# Patient Record
Sex: Male | Born: 1980 | Race: White | Hispanic: No | Marital: Single | Smoking: Never smoker
Health system: Southern US, Community
[De-identification: ages and names within clinical notes are randomized; demographics above are authoritative.]

## PROBLEM LIST (undated history)

## (undated) ENCOUNTER — Ambulatory Visit: Admission: EM | Payer: Medicaid Other

## (undated) DIAGNOSIS — I10 Essential (primary) hypertension: Secondary | ICD-10-CM

## (undated) DIAGNOSIS — I82409 Acute embolism and thrombosis of unspecified deep veins of unspecified lower extremity: Secondary | ICD-10-CM

## (undated) DIAGNOSIS — T8859XA Other complications of anesthesia, initial encounter: Secondary | ICD-10-CM

## (undated) DIAGNOSIS — R569 Unspecified convulsions: Secondary | ICD-10-CM

## (undated) DIAGNOSIS — T4145XA Adverse effect of unspecified anesthetic, initial encounter: Secondary | ICD-10-CM

## (undated) DIAGNOSIS — M259 Joint disorder, unspecified: Secondary | ICD-10-CM

## (undated) DIAGNOSIS — M1990 Unspecified osteoarthritis, unspecified site: Secondary | ICD-10-CM

## (undated) HISTORY — DX: Essential (primary) hypertension: I10

## (undated) HISTORY — PX: HIP SURGERY: SHX245

---
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
## 2002-09-24 ENCOUNTER — Encounter: Payer: Self-pay | Admitting: *Deleted

## 2002-09-24 ENCOUNTER — Emergency Department (HOSPITAL_COMMUNITY): Admission: EM | Admit: 2002-09-24 | Discharge: 2002-09-24 | Payer: Self-pay | Admitting: *Deleted

## 2004-08-18 ENCOUNTER — Inpatient Hospital Stay (HOSPITAL_COMMUNITY): Admission: EM | Admit: 2004-08-18 | Discharge: 2004-08-20 | Payer: Self-pay | Admitting: Emergency Medicine

## 2006-10-21 IMAGING — NM NM MYOCAR MULTI W/ SPECT
2 series · 12 of 12 positions shown · non-contrast
Comparison: none

HISTORY: Chest pain

[Series 1: cr cardiac tc low dose · 6.52mm/px · 6 of 64 frames shown]
[frame 6/64]
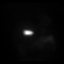
[frame 16/64]
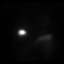
[frame 27/64]
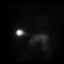
[frame 38/64]
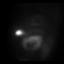
[frame 48/64]
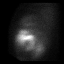
[frame 59/64]
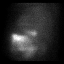

[Series 1: cs cardiac tc hi dose · 6.52mm/px · 6 of 512 frames shown]
[frame 43/512]
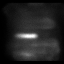
[frame 128/512]
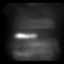
[frame 214/512]
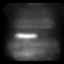
[frame 299/512]
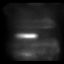
[frame 384/512]
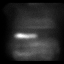
[frame 470/512]
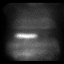

[12 of 12 positions shown; findings below may reference images not displayed]

NUCLEAR MEDICINE MYOCARDIAL PERFUSION SPECT MULTIPLE:
NUCLEAR MEDICINE MYOCARDIAL PERFUSION WALL MOTION:
NUCLEAR MEDICINE MYOCARDIAL PERFUSION EJECTION FRACTION:

At stress, 30 mCi of Ic-EEm Myoview was injected for myocardial perfusion
imaging.
Resting exam performed prior to stress using 10 mCi Ic-EEm Myoview.

Myocardial perfusion SPECT images obtained after stress are normal.
No pulmonary uptake of tracer.
Resting exam unchanged.

Normal left ventricular ejection fraction of 54% calculated on gated SPECT
images after stress.
Calculated EDV 135 ml and ESV 62 ml.
Normal wall motion.
IMPRESSION: Normal exam.

## 2016-01-21 ENCOUNTER — Encounter (HOSPITAL_COMMUNITY): Payer: Self-pay | Admitting: Emergency Medicine

## 2016-01-21 ENCOUNTER — Emergency Department (HOSPITAL_COMMUNITY)
Admission: EM | Admit: 2016-01-21 | Discharge: 2016-01-21 | Disposition: A | Discharge status: Home / Self Care | Payer: Self-pay | Attending: Emergency Medicine | Admitting: Emergency Medicine

## 2016-01-21 DIAGNOSIS — Z79899 Other long term (current) drug therapy: Secondary | ICD-10-CM | POA: Insufficient documentation

## 2016-01-21 DIAGNOSIS — R339 Retention of urine, unspecified: Secondary | ICD-10-CM | POA: Insufficient documentation

## 2016-01-21 HISTORY — DX: Joint disorder, unspecified: M25.9

## 2016-01-21 HISTORY — DX: Unspecified convulsions: R56.9

## 2016-01-21 LAB — CBC WITH DIFFERENTIAL/PLATELET
BASOS ABS: 0 10*3/uL (ref 0.0–0.1)
BASOS PCT: 0 %
Eosinophils Absolute: 0.2 10*3/uL (ref 0.0–0.7)
Eosinophils Relative: 2 %
HCT: 47.9 % (ref 39.0–52.0)
Hemoglobin: 16.2 g/dL (ref 13.0–17.0)
LYMPHS PCT: 31 %
Lymphs Abs: 3 10*3/uL (ref 0.7–4.0)
MCH: 30.2 pg (ref 26.0–34.0)
MCHC: 33.8 g/dL (ref 30.0–36.0)
MCV: 89.4 fL (ref 78.0–100.0)
Monocytes Absolute: 0.8 10*3/uL (ref 0.1–1.0)
Monocytes Relative: 9 %
NEUTROS ABS: 5.5 10*3/uL (ref 1.7–7.7)
NEUTROS PCT: 58 %
PLATELETS: 213 10*3/uL (ref 150–400)
RBC: 5.36 MIL/uL (ref 4.22–5.81)
RDW: 13 % (ref 11.5–15.5)
WBC: 9.5 10*3/uL (ref 4.0–10.5)

## 2016-01-21 LAB — BASIC METABOLIC PANEL
Anion gap: 5 (ref 5–15)
BUN: 13 mg/dL (ref 6–20)
CALCIUM: 8.8 mg/dL — AB (ref 8.9–10.3)
CO2: 30 mmol/L (ref 22–32)
Chloride: 104 mmol/L (ref 101–111)
Creatinine, Ser: 0.68 mg/dL (ref 0.61–1.24)
GFR calc Af Amer: >60 mL/min (ref >60)
GLUCOSE: 79 mg/dL (ref 65–99)
POTASSIUM: 3.7 mmol/L (ref 3.5–5.1)
SODIUM: 139 mmol/L (ref 135–145)

## 2016-01-21 LAB — URINALYSIS, ROUTINE W REFLEX MICROSCOPIC
Bilirubin Urine: NEGATIVE
Glucose, UA: NEGATIVE mg/dL
Hgb urine dipstick: NEGATIVE
KETONES UR: NEGATIVE mg/dL
LEUKOCYTES UA: NEGATIVE
NITRITE: NEGATIVE
PROTEIN: NEGATIVE mg/dL
Specific Gravity, Urine: 1.02 (ref 1.005–1.030)
pH: 5.5 (ref 5.0–8.0)

## 2016-01-21 MED ORDER — PHENAZOPYRIDINE HCL 200 MG PO TABS: 200.0000 mg | ORAL_TABLET | Freq: Three times a day (TID) | ORAL | 0 refills | Status: DC

## 2016-01-21 NOTE — ED Triage Notes (Signed)
Pt c/o frequent and painful urination, but feels that he is unable to completely empty his bladder x 1.5 weeks. Pt states he was treated with 2 rounds of antibiotics x 1 month ago for UTI. Pt has not been seen by urology.

## 2016-01-21 NOTE — ED Notes (Signed)
287 mL noted in bladder.

## 2016-01-21 NOTE — ED Provider Notes (Signed)
[facility] Provider Note   CSN: [id number] Arrival date & time: 01/21/16  1532     History   Chief Complaint Chief Complaint  Patient presents with  . Dysuria    HPI Eddie Marshall is a 35 y.o. male.  HPI   Eddie Marshall is a 35 y.o. male who presents to the Emergency Department complaining of urinary hesitancy for one month.  He describes feeling weak and having to strain to start his urine flow.  He was seen by his PMD at onset of his symptoms and treated for a UTI.  Took a course of an unknown antibiotic without relief and called in a second antibiotic which was Cipro and had some intermittent relief, but now symptoms returned.  He denies hematuria, abdominal or back pain, fever, or vomiting.  Pt had an appt with urology, but cancelled.     Past Medical History:  Diagnosis Date  . Hip disease   . Seizures (HCC)     There are no active problems to display for this patient.   Past Surgical History:  Procedure Laterality Date  . HIP SURGERY    . KNEE SURGERY         Home Medications    Prior to Admission medications   Not on File    Family History No family history on file.  Social History Social History  Substance Use Topics  . Smoking status: Never Smoker  . Smokeless tobacco: Never Used  . Alcohol use No     Allergies   Review of patient's allergies indicates no known allergies.   Review of Systems Review of Systems  Constitutional: Negative for activity change, appetite change, chills and fever.  Respiratory: Negative for chest tightness and shortness of breath.   Cardiovascular: Negative for chest pain.  Gastrointestinal: Negative for abdominal pain, nausea and vomiting.  Genitourinary: Positive for decreased urine volume, difficulty urinating, dysuria and frequency. Negative for discharge, flank pain, hematuria, penile swelling, scrotal swelling, testicular pain and urgency.  Musculoskeletal: Negative for back pain.  Skin: Negative for  rash.  Neurological: Negative for dizziness, weakness and numbness.  Hematological: Negative for adenopathy.  Psychiatric/Behavioral: Negative for confusion.  All other systems reviewed and are negative.    Physical Exam Updated Vital Signs BP 121/74   Pulse 80   Temp 98 F (36.7 C)   Resp 18   Ht 5\' 7"  (1.702 m)   Wt 129.3 kg   SpO2 97%   BMI 44.64 kg/m   Physical Exam  Constitutional: He is oriented to person, place, and time. He appears well-developed and well-nourished. No distress.  HENT:  Head: Normocephalic and atraumatic.  Cardiovascular: Normal rate, regular rhythm and intact distal pulses.   No murmur heard. Pulmonary/Chest: Effort normal and breath sounds normal. No respiratory distress. He has no wheezes. He has no rales.  Abdominal: Soft. Normal appearance. He exhibits no distension and no mass. There is no hepatosplenomegaly. There is no tenderness. There is no rigidity, no rebound, no guarding, no CVA tenderness and no tenderness at McBurney's point.  Genitourinary:  Genitourinary Comments: Digital rectal exam performed.  Limited due to body habitus,  Prostate is NT, does not appear to be enlarged.  Not boggy  Musculoskeletal: Normal range of motion. He exhibits no edema.  Neurological: He is alert and oriented to person, place, and time. Coordination normal.  Skin: Skin is warm and dry. No rash noted.  Nursing note and vitals reviewed.    ED Treatments / Results  Labs (all labs ordered are listed, but only abnormal results are displayed) Labs Reviewed  URINE CULTURE  URINALYSIS, ROUTINE W REFLEX MICROSCOPIC (NOT AT [facility])  CBC WITH DIFFERENTIAL/PLATELET  BASIC METABOLIC PANEL    EKG  EKG Interpretation None       Radiology No results found.  Procedures Procedures (including critical care time)  Medications Ordered in ED Medications - No data to display   Initial Impression / Assessment and Plan / ED Course  I have reviewed the triage  vital signs and the nursing notes.  Pertinent labs & imaging results that were available during my care of the patient were reviewed by me and considered in my medical decision making (see chart for details).  Clinical Course   Pt is well appearing.  Non-toxic.  abd remains soft, NT.    Bladder scan showed 287 cc urine, but scan was performed approx one hr post void. Digital rectal exam was limited due to body habitus, but Prostate was NT not boggy.    Pt agrees to arrange urology f/u.  Return precautions given.  Final Clinical Impressions(s) / ED Diagnoses   Final diagnoses:  Urinary retention    New Prescriptions New Prescriptions   No medications on file     Pauline Ausammy Haden Cavenaugh, PA-C 01/24/16 0848    Bethann BerkshireJoseph Zammit, MD 01/26/16 1432

## 2016-01-21 NOTE — Discharge Instructions (Signed)
Call Dr. Dimas MillinMcKenzie's office to arrange a follow-up appt.

## 2016-01-23 LAB — URINE CULTURE

## 2016-01-30 ENCOUNTER — Ambulatory Visit (INDEPENDENT_AMBULATORY_CARE_PROVIDER_SITE_OTHER): Payer: Self-pay | Admitting: Urology

## 2016-01-30 DIAGNOSIS — R338 Other retention of urine: Secondary | ICD-10-CM

## 2016-01-30 DIAGNOSIS — N35011 Post-traumatic bulbous urethral stricture: Secondary | ICD-10-CM

## 2016-01-30 DIAGNOSIS — R350 Frequency of micturition: Secondary | ICD-10-CM

## 2016-02-02 ENCOUNTER — Other Ambulatory Visit: Payer: Self-pay | Admitting: Radiology

## 2016-02-02 ENCOUNTER — Telehealth: Payer: Self-pay | Admitting: Radiology

## 2016-02-02 DIAGNOSIS — N35011 Post-traumatic bulbous urethral stricture: Secondary | ICD-10-CM

## 2016-02-02 NOTE — Telephone Encounter (Signed)
Notified pt of surgery scheduled with Dr Annabell HowellsWrenn on 02/06/16 & pre-admit testing appt on 02/04/16 @2 :15 at [facility] Short Stay Dept. Pt voices understanding.

## 2016-02-03 NOTE — Patient Instructions (Signed)
Huber Mathers  02/03/2016     @PREFPERIOPPHARMACY @   Your procedure is scheduled on  02/06/2016   Report to [facility] at  1115  A.M.  Call this number if you have problems the morning of surgery:  [phone]   Remember:  Do not eat food or drink liquids after midnight.  Take these medicines the morning of surgery with A SIP OF WATER  Pyridium.   Do not wear jewelry, make-up or nail polish.  Do not wear lotions, powders, or perfumes, or deoderant.  Do not shave 48 hours prior to surgery.  Men may shave face and neck.  Do not bring valuables to the hospital.  [facility] is not responsible for any belongings or valuables.  Contacts, dentures or bridgework may not be worn into surgery.  Leave your suitcase in the car.  After surgery it may be brought to your room.  For patients admitted to the hospital, discharge time will be determined by your treatment team.  Patients discharged the day of surgery will not be allowed to drive home.   Name and phone number of your driver:   family Special instructions:  none Please read over the following fact sheets that you were given. Anesthesia Post-op Instructions and Care and Recovery After Surgery       Cystoscopy Cystoscopy is a procedure that is used to help your caregiver diagnose and sometimes treat conditions that affect your lower urinary tract. Your lower urinary tract includes your bladder and the tube through which urine passes from your bladder out of your body (urethra). Cystoscopy is performed with a thin, tube-shaped instrument (cystoscope). The cystoscope has lenses and a light at the end so that your caregiver can see inside your bladder. The cystoscope is inserted at the entrance of your urethra. Your caregiver guides it through your urethra and into your bladder. There are two main types of cystoscopy:  Flexible cystoscopy (with a flexible cystoscope).  Rigid cystoscopy (with a rigid  cystoscope). Cystoscopy may be recommended for many conditions, including:  Urinary tract infections.  Blood in your urine (hematuria).  Loss of bladder control (urinary incontinence) or overactive bladder.  Unusual cells found in a urine sample.  Urinary blockage.  Painful urination. Cystoscopy may also be done to remove a sample of your tissue to be checked under a microscope (biopsy). It may also be done to remove or destroy bladder stones. LET YOUR CAREGIVER KNOW ABOUT:  Allergies to food or medicine.  Medicines taken, including vitamins, herbs, eyedrops, over-the-counter medicines, and creams.  Use of steroids (by mouth or creams).  Previous problems with anesthetics or numbing medicines.  History of bleeding problems or blood clots.  Previous surgery.  Other health problems, including diabetes and kidney problems.  Possibility of pregnancy, if this applies. PROCEDURE The area around the opening to your urethra will be cleaned. A medicine to numb your urethra (local anesthetic) is used. If a tissue sample or stone is removed during the procedure, you may be given a medicine to make you sleep (general anesthetic). Your caregiver will gently insert the tip of the cystoscope into your urethra. The cystoscope will be slowly glided through your urethra and into your bladder. Sterile fluid will flow through the cystoscope and into your bladder. The fluid will expand and stretch your bladder. This gives your caregiver a better view of your bladder walls. The procedure lasts about 15-20 minutes. AFTER THE PROCEDURE If a local  anesthetic is used, you will be allowed to go home as soon as you are ready. If a general anesthetic is used, you will be taken to a recovery area until you are stable. You may have temporary bleeding and burning on urination.   This information is not intended to replace advice given to you by your health care provider. Make sure you discuss any questions you  have with your health care provider.   Document Released: 04/02/2000 Document Revised: 04/26/2014 Document Reviewed: 09/27/2011 Elsevier Interactive Patient Education 2016 [employer] Cystoscopy, Care After Refer to this sheet in the next few weeks. These instructions provide you with information on caring for yourself after your procedure. Your caregiver may also give you more specific instructions. Your treatment has been planned according to current medical practices, but problems sometimes occur. Call your caregiver if you have any problems or questions after your procedure. HOME CARE INSTRUCTIONS  Things you can do to ease any discomfort after your procedure include:  Drinking enough water and fluids to keep your urine clear or pale yellow.  Taking a warm bath to relieve any burning feelings. SEEK IMMEDIATE MEDICAL CARE IF:   You have an increase in blood in your urine.  You notice blood clots in your urine.  You have difficulty passing urine.  You have the chills.  You have abdominal pain.  You have a fever or persistent symptoms for more than 2-3 days.  You have a fever and your symptoms suddenly get worse. MAKE SURE YOU:   Understand these instructions.  Will watch your condition.  Will get help right away if you are not doing well or get worse.   This information is not intended to replace advice given to you by your health care provider. Make sure you discuss any questions you have with your health care provider.   Document Released: 10/23/2004 Document Revised: 04/26/2014 Document Reviewed: 09/27/2011 Elsevier Interactive Patient Education 2016 [employer] PATIENT INSTRUCTIONS POST-ANESTHESIA  IMMEDIATELY FOLLOWING SURGERY:  Do not drive or operate machinery for the first twenty four hours after surgery.  Do not make any important decisions for twenty four hours after surgery or while taking narcotic pain medications or sedatives.  If you develop intractable  nausea and vomiting or a severe headache please notify your doctor immediately.  FOLLOW-UP:  Please make an appointment with your surgeon as instructed. You do not need to follow up with anesthesia unless specifically instructed to do so.  WOUND CARE INSTRUCTIONS (if applicable):  Keep a dry clean dressing on the anesthesia/puncture wound site if there is drainage.  Once the wound has quit draining you may leave it open to air.  Generally you should leave the bandage intact for twenty four hours unless there is drainage.  If the epidural site drains for more than 36-48 hours please call the anesthesia department.  QUESTIONS?:  Please feel free to call your physician or the hospital operator if you have any questions, and they will be happy to assist you.

## 2016-02-04 ENCOUNTER — Encounter (HOSPITAL_COMMUNITY): Payer: Self-pay

## 2016-02-04 ENCOUNTER — Encounter (HOSPITAL_COMMUNITY)
Admission: RE | Admit: 2016-02-04 | Discharge: 2016-02-04 | Disposition: A | Discharge status: Home / Self Care | Payer: Self-pay | Source: Ambulatory Visit | Attending: Urology | Admitting: Urology

## 2016-02-04 DIAGNOSIS — Z01818 Encounter for other preprocedural examination: Secondary | ICD-10-CM | POA: Insufficient documentation

## 2016-02-04 HISTORY — DX: Adverse effect of unspecified anesthetic, initial encounter: T41.45XA

## 2016-02-04 HISTORY — DX: Unspecified osteoarthritis, unspecified site: M19.90

## 2016-02-04 HISTORY — DX: Other complications of anesthesia, initial encounter: T88.59XA

## 2016-02-06 ENCOUNTER — Ambulatory Visit (HOSPITAL_COMMUNITY): Payer: Self-pay | Admitting: Anesthesiology

## 2016-02-06 ENCOUNTER — Ambulatory Visit (HOSPITAL_COMMUNITY)
Admission: RE | Admit: 2016-02-06 | Discharge: 2016-02-06 | Disposition: A | Discharge status: Home / Self Care | Payer: Self-pay | Source: Ambulatory Visit | Attending: Urology | Admitting: Urology

## 2016-02-06 ENCOUNTER — Encounter (HOSPITAL_COMMUNITY)
Admission: RE | Disposition: A | Discharge status: Home / Self Care | Payer: Self-pay | Source: Ambulatory Visit | Attending: Urology

## 2016-02-06 ENCOUNTER — Ambulatory Visit (HOSPITAL_COMMUNITY): Payer: Self-pay

## 2016-02-06 ENCOUNTER — Encounter (HOSPITAL_COMMUNITY): Payer: Self-pay | Admitting: *Deleted

## 2016-02-06 DIAGNOSIS — N359 Urethral stricture, unspecified: Secondary | ICD-10-CM | POA: Insufficient documentation

## 2016-02-06 DIAGNOSIS — N35919 Unspecified urethral stricture, male, unspecified site: Secondary | ICD-10-CM

## 2016-02-06 DIAGNOSIS — N35011 Post-traumatic bulbous urethral stricture: Secondary | ICD-10-CM

## 2016-02-06 DIAGNOSIS — M1990 Unspecified osteoarthritis, unspecified site: Secondary | ICD-10-CM | POA: Insufficient documentation

## 2016-02-06 DIAGNOSIS — Z96649 Presence of unspecified artificial hip joint: Secondary | ICD-10-CM | POA: Insufficient documentation

## 2016-02-06 DIAGNOSIS — N358 Other urethral stricture: Secondary | ICD-10-CM

## 2016-02-06 HISTORY — PX: CYSTOSCOPY WITH URETHRAL DILATATION: SHX5125

## 2016-02-06 HISTORY — PX: CYSTOSCOPY WITH RETROGRADE URETHROGRAM: SHX6309

## 2016-02-06 SURGERY — CYSTOSCOPY, WITH URETHRAL DILATION
Anesthesia: General

## 2016-02-06 MED ORDER — STERILE WATER FOR IRRIGATION IR SOLN
Status: DC | PRN
Start: 2016-02-06 — End: 2016-02-06
  Administered 2016-02-06: 1000 mL

## 2016-02-06 MED ORDER — OXYCODONE HCL 5 MG/5ML PO SOLN: 5.0000 mg | Freq: Once | ORAL | Status: AC | PRN

## 2016-02-06 MED ORDER — FENTANYL CITRATE (PF) 100 MCG/2ML IJ SOLN
INTRAMUSCULAR | Status: DC | PRN
  Administered 2016-02-06: 25 ug via INTRAVENOUS
  Administered 2016-02-06 (×2): 50 ug via INTRAVENOUS

## 2016-02-06 MED ORDER — STERILE WATER FOR IRRIGATION IR SOLN
Status: DC | PRN
  Administered 2016-02-06: 3000 mL

## 2016-02-06 MED ORDER — LIDOCAINE HCL (PF) 1 % IJ SOLN
INTRAMUSCULAR | Status: AC
  Filled 2016-02-06: qty 5

## 2016-02-06 MED ORDER — FENTANYL CITRATE (PF) 100 MCG/2ML IJ SOLN
INTRAMUSCULAR | Status: AC
  Filled 2016-02-06: qty 2

## 2016-02-06 MED ORDER — LACTATED RINGERS IV SOLN
INTRAVENOUS | Status: DC
  Administered 2016-02-06: 12:00:00 via INTRAVENOUS

## 2016-02-06 MED ORDER — HYDROCODONE-ACETAMINOPHEN 5-325 MG PO TABS: 1.0000 | ORAL_TABLET | Freq: Four times a day (QID) | ORAL | 0 refills | Status: DC | PRN

## 2016-02-06 MED ORDER — MIDAZOLAM HCL 2 MG/2ML IJ SOLN
INTRAMUSCULAR | Status: AC
  Filled 2016-02-06: qty 2

## 2016-02-06 MED ORDER — DIATRIZOATE MEGLUMINE 30 % UR SOLN
URETHRAL | Status: DC | PRN
Start: 2016-02-06 — End: 2016-02-06
  Administered 2016-02-06: 100 mL via URETHRAL

## 2016-02-06 MED ORDER — DEXTROSE 5 % IV SOLN: 3.0000 g | INTRAVENOUS | Status: DC

## 2016-02-06 MED ORDER — OXYCODONE HCL 5 MG PO TABS
5.0000 mg | ORAL_TABLET | Freq: Once | ORAL | Status: AC | PRN
  Administered 2016-02-06: 5 mg via ORAL
  Filled 2016-02-06: qty 1

## 2016-02-06 MED ORDER — DIATRIZOATE MEGLUMINE 30 % UR SOLN
URETHRAL | Status: AC
  Filled 2016-02-06: qty 300

## 2016-02-06 MED ORDER — CEFAZOLIN IN D5W 1 GM/50ML IV SOLN
1.0000 g | INTRAVENOUS | Status: AC
  Filled 2016-02-06: qty 50

## 2016-02-06 MED ORDER — GLYCOPYRROLATE 0.2 MG/ML IJ SOLN
INTRAMUSCULAR | Status: AC
  Filled 2016-02-06: qty 1

## 2016-02-06 MED ORDER — PROPOFOL 10 MG/ML IV BOLUS
INTRAVENOUS | Status: AC
  Filled 2016-02-06: qty 40

## 2016-02-06 MED ORDER — LIDOCAINE HCL 1 % IJ SOLN
INTRAMUSCULAR | Status: DC | PRN
  Administered 2016-02-06: 35 mg via INTRADERMAL

## 2016-02-06 MED ORDER — MIDAZOLAM HCL 5 MG/5ML IJ SOLN
INTRAMUSCULAR | Status: DC | PRN
  Administered 2016-02-06: 2 mg via INTRAVENOUS

## 2016-02-06 MED ORDER — ONDANSETRON HCL 4 MG/2ML IJ SOLN
4.0000 mg | Freq: Four times a day (QID) | INTRAMUSCULAR | Status: AC | PRN
  Administered 2016-02-06: 4 mg via INTRAVENOUS
  Filled 2016-02-06: qty 2

## 2016-02-06 MED ORDER — FENTANYL CITRATE (PF) 100 MCG/2ML IJ SOLN: 25.0000 ug | INTRAMUSCULAR | Status: DC | PRN

## 2016-02-06 MED ORDER — PROPOFOL 10 MG/ML IV BOLUS
INTRAVENOUS | Status: DC | PRN
  Administered 2016-02-06: 40 mg via INTRAVENOUS
  Administered 2016-02-06: 180 mg via INTRAVENOUS

## 2016-02-06 MED ORDER — CEFAZOLIN SODIUM-DEXTROSE 2-4 GM/100ML-% IV SOLN
2.0000 g | INTRAVENOUS | Status: AC
  Administered 2016-02-06: 3 g via INTRAVENOUS
  Filled 2016-02-06: qty 100

## 2016-02-06 SURGICAL SUPPLY — 29 items
BAG DRAIN URO TABLE W/ADPT NS (DRAPE) ×3 IMPLANT
BAG URINE DRAINAGE (UROLOGICAL SUPPLIES) ×3 IMPLANT
BALLN NEPHROSTOMY (BALLOONS) ×3
BALLOON NEPHROSTOMY (BALLOONS) ×1 IMPLANT
CATH FOLEY 2W COUNCIL 5CC 18FR (CATHETERS) ×3 IMPLANT
CATH FOLEY 2WAY SLVR  5CC 16FR (CATHETERS)
CATH FOLEY 2WAY SLVR 5CC 16FR (CATHETERS) IMPLANT
CLOTH BEACON ORANGE TIMEOUT ST (SAFETY) ×3 IMPLANT
ELECT REM PT RETURN 9FT ADLT (ELECTROSURGICAL) ×3
ELECTRODE REM PT RTRN 9FT ADLT (ELECTROSURGICAL) ×1 IMPLANT
GLOVE BIOGEL PI IND STRL 6.5 (GLOVE) ×1 IMPLANT
GLOVE BIOGEL PI IND STRL 7.0 (GLOVE) ×1 IMPLANT
GLOVE BIOGEL PI INDICATOR 6.5 (GLOVE) ×2
GLOVE BIOGEL PI INDICATOR 7.0 (GLOVE) ×2
GLOVE ECLIPSE 6.5 STRL STRAW (GLOVE) ×3 IMPLANT
GLOVE EXAM NITRILE PF MED BLUE (GLOVE) ×3 IMPLANT
GLOVE SURG SS PI 8.0 STRL IVOR (GLOVE) ×3 IMPLANT
GOWN STRL REUS W/TWL LRG LVL3 (GOWN DISPOSABLE) ×6 IMPLANT
GUIDEWIRE STR DUAL SENSOR (WIRE) ×3 IMPLANT
NDL SAFETY ECLIPSE 18X1.5 (NEEDLE) IMPLANT
NEEDLE HYPO 18GX1.5 SHARP (NEEDLE)
NEEDLE HYPO 22GX1.5 SAFETY (NEEDLE) IMPLANT
NS IRRIG 500ML POUR BTL (IV SOLUTION) IMPLANT
PACK CYSTO (CUSTOM PROCEDURE TRAY) ×3 IMPLANT
SYR 20CC LL (SYRINGE) IMPLANT
SYR 30ML LL (SYRINGE) IMPLANT
SYR BULB IRRIGATION 50ML (SYRINGE) ×3 IMPLANT
TOWEL OR 17X26 4PK STRL BLUE (TOWEL DISPOSABLE) ×3 IMPLANT
WATER STERILE IRR 3000ML UROMA (IV SOLUTION) ×3 IMPLANT

## 2016-02-06 NOTE — Brief Op Note (Signed)
02/06/2016  1:25 PM  PATIENT:  Eddie Marshall  35 y.o. male  PRE-OPERATIVE DIAGNOSIS:  uretheral stricture  POST-OPERATIVE DIAGNOSIS:  uretheral stricture  PROCEDURE:  Procedure(s): CYSTOSCOPY WITH URETHRAL DILATATION (N/A) CYSTOSCOPY WITH RETROGRADE URETHROGRAM (N/A)  SURGEON:  Surgeon(s) and Role:    * Eddie PippinJohn Alvar Malinoski, MD - Primary  PHYSICIAN ASSISTANT:   ASSISTANTS: none   ANESTHESIA:   general  EBL:  Total I/O In: 400 [I.V.:400] Out: 5 [Blood:5]  BLOOD ADMINISTERED:none  DRAINS: Urinary Catheter (Foley)   LOCAL MEDICATIONS USED:  NONE  SPECIMEN:  No Specimen  DISPOSITION OF SPECIMEN:  N/A  COUNTS:  YES  TOURNIQUET:  * No tourniquets in log *  DICTATION: .Other Dictation: Dictation Number [id number]  PLAN OF CARE: Discharge to home after PACU  PATIENT DISPOSITION:  PACU - hemodynamically stable.   Delay start of Pharmacological VTE agent (>24hrs) due to surgical blood loss or risk of bleeding: not applicable

## 2016-02-06 NOTE — H&P (Signed)
CC/HPI: Frequency, Nocturia and Urgency     Eddie Marshall is a 35 yo WM with a 1.5 month history of frequency with a sensation of a restricted stream. He voids 10-20x daily and nocturia x 3-4x. There is intermittency and straining with a weak stream. He has had no gross hematuria. He has had no dysuria but it is painful to void. He has some dizziness after voiding. He sometimes feels like he empties partially. He has no flank pain. He has been treated with 2 rounds of antibiotics from [facility] and he went to the ER and got pyridium which helped. He has had no GU surgery but did have a catheter with hip surgery about 3-4 years ago. His symptoms had been present a few months ago resolved and the recurred. He had a similar episode last year. He may have had a kidney stone a year or so ago.     ALLERGIES:     MEDICATIONS: None   GU PSH: None   NON-GU PSH: Hip Replacement    GU PMH: None   NON-GU PMH: Seizure disorder    FAMILY HISTORY: heart - Runs in Family Hypertension - Runs in Family   SOCIAL HISTORY: Marital Status: Single Current Smoking Status: Patient has never smoked.  Has never drank.  Does not drink caffeine.    REVIEW OF SYSTEMS:    GU Review Male:   Patient reports frequent urination, burning/ pain with urination, get up at night to urinate, leakage of urine, stream starts and stops, trouble starting your stream, and have to strain to urinate . Patient denies hard to postpone urination, erection problems, and penile pain.  Gastrointestinal (Upper):   Patient denies vomiting, nausea, and indigestion/ heartburn.  Gastrointestinal (Lower):   Patient denies diarrhea and constipation.  Constitutional:   Patient reports night sweats. Patient denies fever, weight loss, and fatigue.  Skin:   Patient denies skin rash/ lesion and itching.  Eyes:   Patient denies blurred vision and double vision.  Ears/ Nose/ Throat:   Patient reports sore throat. Patient denies sinus problems.   Hematologic/Lymphatic:   Patient denies swollen glands and easy bruising.  Cardiovascular:   Patient denies leg swelling and chest pains.  Respiratory:   Patient denies cough and shortness of breath.  Endocrine:   Patient denies excessive thirst.  Musculoskeletal:   Patient reports back pain and joint pain.   Neurological:   Patient denies headaches and dizziness.  Psychologic:   Patient denies depression and anxiety.   VITAL SIGNS:      01/30/2016 02:30 PM  Weight 280 lb / 127.01 kg  Height 65.5 in / 166.37 cm  BP 111/76 mmHg  Pulse 76 /min  Temperature 98.2 F / 37 C  BMI 45.9 kg/m   GU PHYSICAL EXAMINATION:    Anus and Perineum: No hemorrhoids. No anal stenosis. No rectal fissure, no anal fissure. No edema, no dimple, no perineal tenderness, no anal tenderness.  Scrotum: No lesions. No edema. No cysts. No warts.  Epididymides: Right: no spermatocele, no masses, no cysts, no tenderness, no induration, no enlargement. Left: no spermatocele, no masses, no cysts, no tenderness, no induration, no enlargement.  Testes: No tenderness, no swelling, no enlargement left testes. No tenderness, no swelling, no enlargement right testes. Normal location left testes. Normal location right testes. No mass, no cyst, no varicocele, no hydrocele left testes. No mass, no cyst, no varicocele, no hydrocele right testes.  Urethral Meatus: Normal size. No lesion, no wart, no discharge, no  polyp. Normal location.  Penis: Circumcised, no warts, no cracks. No dorsal Peyronie's plaques, no left corporal Peyronie's plaques, no right corporal Peyronie's plaques, no scarring, no warts. No balanitis, no meatal stenosis.  Prostate: 40 gram or 2+ size. Left lobe normal consistency, right lobe normal consistency. Symmetrical lobes. No prostate nodule. Left lobe no tenderness, right lobe no tenderness.  Seminal Vesicles: Nonpalpable.  Sphincter Tone: Normal sphincter. No rectal tenderness. No rectal mass.     MULTI-SYSTEM PHYSICAL EXAMINATION:    Constitutional: Well-nourished. No physical deformities. Normally developed. Good grooming.  Neck: Neck symmetrical, not swollen. Normal tracheal position.  Respiratory: No labored breathing, no use of accessory muscles.   Cardiovascular: Normal temperature, normal extremity pulses, no swelling, no varicosities.  Lymphatic: No enlargement of neck, axillae, groin.  Skin: No paleness, no jaundice, no cyanosis. No lesion, no ulcer, no rash.  Neurologic / Psychiatric: Oriented to time, oriented to place, oriented to person. No depression, no anxiety, no agitation.  Gastrointestinal: No mass, no tenderness, no rigidity, non obese abdomen.  Eyes: Normal conjunctivae. Normal eyelids.  Ears, Nose, Mouth, and Throat: Left ear no scars, no lesions, no masses. Right ear no scars, no lesions, no masses. Nose no scars, no lesions, no masses. Normal hearing. Normal lips.  Musculoskeletal: Normal gait and station of head and neck.     PAST DATA REVIEWED:  Source Of History:  Patient  Lab Test Review:   CBC, CMP  Records Review:   Previous Doctor Records  Urine Test Review:   Urinalysis, Urine Culture and Sensitivity  Notes:                     [facility]. UA and Culture were negative in the ER on 10/4. CMP and CBC were normal as well.    PROCEDURES:         Flexible Cystoscopy - 52000  Risks, benefits, and some of the potential complications of the procedure were discussed. 10ml of 2% lidocaine jelly was instilled intraurethrally.  Cipro 500mg  given for antibiotic prophylaxis.     Meatus:  Normal size. Normal location. Normal condition.  Urethra:  Severe bulbous stricture. with a small false passage adjacent to the distal portion of the stricture. The visible extent is about 2cm. I couldn't pass beyond this point.       The procedure was well tolerated and there were no complications.  He will need cystoscopy and dilation in the  OR.           PVR Ultrasound - [id number]  Scanned Volume: 510 cc         Urinalysis - 81003 Dipstick Dipstick Cont'd  Specimen: Voided Bilirubin: Neg  Color: Yellow Ketones: Neg  Appearance: Clear Blood: 1+  Specific Gravity: 1.010 Protein: Neg  pH: 5.0 Urobilinogen: 0.2  Glucose: Neg Nitrites: Neg    Leukocyte Esterase: Neg         Notes:   An attempt was made to pass a 9716fr coude prior to cystoscopy and the catheter wouldn't pass beyond the prostate. It was felt cystoscopy was indicated.    ASSESSMENT:      ICD-10 Details  1 GU:   Urinary Frequency - R35.0 He has overflow voiding with a 500ml PVR.   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   3   Urinary Retention - R33.8  PLAN:           Orders Labs Urinalysis w/Scope          Schedule Return Visit: ASAP - Schedule Surgery  Procedure: 01/30/2016 at [facility] - [phone] - PVR Ultrasound (Bladder Scan / Residual Urine) - 19147          Document Letter(s):  Created for Patient: Clinical Summary         Notes:   He has near retention with overflow voiding and a severe bulbar stricture.   I am going to get him set up next week for cystoscopy with an attempt at balloon dilation of the stricture. I reviewed the risks of bleeding, infection, penile injury, erectile dysfunction, need for secondary procedures or a suprapubic tube, thrombotic events and anesthetic complications.   CC: Dr. Elfredia Nevins.

## 2016-02-06 NOTE — Discharge Instructions (Signed)
Cystoscopy, Care After Refer to this sheet in the next few weeks. These instructions provide you with information on caring for yourself after your procedure. Your caregiver may also give you more specific instructions. Your treatment has been planned according to current medical practices, but problems sometimes occur. Call your caregiver if you have any problems or questions after your procedure. HOME CARE INSTRUCTIONS  Things you can do to ease any discomfort after your procedure include:  Drinking enough water and fluids to keep your urine clear or pale yellow.  Taking a warm bath to relieve any burning feelings. SEEK IMMEDIATE MEDICAL CARE IF:   You have an increase in blood in your urine.  You notice blood clots in your urine.  You have difficulty passing urine.  You have the chills.  You have abdominal pain.  You have a fever or persistent symptoms for more than 2-3 days.  You have a fever and your symptoms suddenly get worse. MAKE SURE YOU:   Understand these instructions.  Will watch your condition.  Will get help right away if you are not doing well or get worse.   This information is not intended to replace advice given to you by your health care provider. Make sure you discuss any questions you have with your health care provider.   Document Released: 10/23/2004 Document Revised: 04/26/2014 Document Reviewed: 09/27/2011 Elsevier Interactive Patient Education 2016 [employer] Foley Catheter Care, Adult A Foley catheter is a soft, flexible tube that is placed into the bladder to drain urine. A Foley catheter may be inserted if:  You leak urine or are not able to control when you urinate (urinary incontinence).  You are not able to urinate when you need to (urinary retention).  You had prostate surgery or surgery on the genitals.  You have certain medical conditions, such as multiple sclerosis, dementia, or a spinal cord injury. If you are going home with a  Foley catheter in place, follow the instructions below. TAKING CARE OF THE CATHETER 1. Wash your hands with soap and water. 2. Using mild soap and warm water on a clean washcloth:  Clean the area on your body closest to the catheter insertion site using a circular motion, moving away from the catheter. Never wipe toward the catheter because this could sweep bacteria up into the urethra and cause infection.  Remove all traces of soap. Pat the area dry with a clean towel. For males, reposition the foreskin. 3. Attach the catheter to your leg so there is no tension on the catheter. Use adhesive tape or a leg strap. If you are using adhesive tape, remove any sticky residue left behind by the previous tape you used. 4. Keep the drainage bag below the level of the bladder, but keep it off the floor. 5. Check throughout the day to be sure the catheter is working and urine is draining freely. Make sure the tubing does not become kinked. 6. Do not pull on the catheter or try to remove it. Pulling could damage internal tissues. TAKING CARE OF THE DRAINAGE BAGS You will be given two drainage bags to take home. One is a large overnight drainage bag, and the other is a smaller leg bag that fits underneath clothing. You may wear the overnight bag at any time, but you should never wear the smaller leg bag at night. Follow the instructions below for how to empty, change, and clean your drainage bags. Emptying the Drainage Bag You must empty your drainage bag  when it is  - full or at least 2-3 times a day. 1. Wash your hands with soap and water. 2. Keep the drainage bag below your hips, below the level of your bladder. This stops urine from going back into the tubing and into your bladder. 3. Hold the dirty bag over the toilet or a clean container. 4. Open the pour spout at the bottom of the bag and empty the urine into the toilet or container. Do not let the pour spout touch the toilet, container, or any other  surface. Doing so can place bacteria on the bag, which can cause an infection. 5. Clean the pour spout with a gauze pad or cotton ball that has rubbing alcohol on it. 6. Close the pour spout. 7. Attach the bag to your leg with adhesive tape or a leg strap. 8. Wash your hands well. Changing the Drainage Bag Change your drainage bag once a month or sooner if it starts to smell bad or look dirty. Below are steps to follow when changing the drainage bag. 1. Wash your hands with soap and water. 2. Pinch off the rubber catheter so that urine does not spill out. 3. Disconnect the catheter tube from the drainage tube at the connection valve. Do not let the tubes touch any surface. 4. Clean the end of the catheter tube with an alcohol wipe. Use a different alcohol wipe to clean the end of the drainage tube. 5. Connect the catheter tube to the drainage tube of the clean drainage bag. 6. Attach the new bag to the leg with adhesive tape or a leg strap. Avoid attaching the new bag too tightly. 7. Wash your hands well. Cleaning the Drainage Bag 1. Wash your hands with soap and water. 2. Wash the bag in warm, soapy water. 3. Rinse the bag thoroughly with warm water. 4. Fill the bag with a solution of white vinegar and water (1 cup vinegar to 1 qt warm water [.2 L vinegar to 1 L warm water]). Close the bag and soak it for 30 minutes in the solution. 5. Rinse the bag with warm water. 6. Hang the bag to dry with the pour spout open and hanging downward. 7. Store the clean bag (once it is dry) in a clean plastic bag. 8. Wash your hands well. PREVENTING INFECTION  Wash your hands before and after handling your catheter.  Take showers daily and wash the area where the catheter enters your body. Do not take baths. Replace wet leg straps with dry ones, if this applies.  Do not use powders, sprays, or lotions on the genital area. Only use creams, lotions, or ointments as directed by your caregiver.  For  females, wipe from front to back after each bowel movement.  Drink enough fluids to keep your urine clear or pale yellow unless you have a fluid restriction.  Do not let the drainage bag or tubing touch or lie on the floor.  Wear cotton underwear to absorb moisture and to keep your skin drier. SEEK MEDICAL CARE IF:   Your urine is cloudy or smells unusually bad.  Your catheter becomes clogged.  You are not draining urine into the bag or your bladder feels full.  Your catheter starts to leak. SEEK IMMEDIATE MEDICAL CARE IF:   You have pain, swelling, redness, or pus where the catheter enters the body.  You have pain in the abdomen, legs, lower back, or bladder.  You have a fever.  You see blood  fill the catheter, or your urine is pink or red.  You have nausea, vomiting, or chills.  Your catheter gets pulled out. MAKE SURE YOU:   Understand these instructions.  Will watch your condition.  Will get help right away if you are not doing well or get worse.   This information is not intended to replace advice given to you by your health care provider. Make sure you discuss any questions you have with your health care provider.   Document Released: 04/05/2005 Document Revised: 08/20/2013 Document Reviewed: 03/27/2012 Elsevier Interactive Patient Education 2016 [employer].  You may remove the foley in the morning by cutting the side arm off the catheter.   It should slide out easily.    Call if you have a fever >101 or heavy bleeding.

## 2016-02-06 NOTE — Anesthesia Postprocedure Evaluation (Signed)
Anesthesia Post Note  Patient: Eddie Marshall  Procedure(s) Performed: Procedure(s) (LRB): CYSTOSCOPY WITH URETHRAL DILATATION (N/A) CYSTOSCOPY WITH RETROGRADE URETHROGRAM (N/A)  Patient location during evaluation: PACU Anesthesia Type: General Level of consciousness: awake, oriented and patient cooperative Pain management: pain level controlled Vital Signs Assessment: post-procedure vital signs reviewed and stable Respiratory status: spontaneous breathing, nonlabored ventilation and respiratory function stable Cardiovascular status: blood pressure returned to baseline and stable Postop Assessment: no signs of nausea or vomiting Anesthetic complications: no    Last Vitals:  Vitals:   02/06/16 1150 02/06/16 1340  BP: 131/84   Pulse:    Resp: 11 (!) (P) 21  Temp:  (P) 36.6 C    Last Pain:  Vitals:   02/06/16 1142  TempSrc: Oral  PainSc: 2                  Amaura Authier J

## 2016-02-06 NOTE — Anesthesia Procedure Notes (Signed)
Procedure Name: LMA Insertion Date/Time: 02/06/2016 1:02 PM Performed by: Despina HiddenIDACAVAGE, Eletha Culbertson J Pre-anesthesia Checklist: Patient identified, Patient being monitored, Emergency Drugs available, Timeout performed and Suction available Patient Re-evaluated:Patient Re-evaluated prior to inductionOxygen Delivery Method: Circle System Utilized Preoxygenation: Pre-oxygenation with 100% oxygen Intubation Type: IV induction Ventilation: Mask ventilation without difficulty LMA: LMA inserted LMA Size: 4.0 Number of attempts: 1 Placement Confirmation: positive ETCO2 and breath sounds checked- equal and bilateral Tube secured with: Tape Dental Injury: Teeth and Oropharynx as per pre-operative assessment

## 2016-02-06 NOTE — Anesthesia Preprocedure Evaluation (Signed)
Anesthesia Evaluation  Patient identified by MRN, date of birth, ID band Patient awake    Reviewed: Allergy & Precautions, H&P , NPO status , Patient's Chart, lab work & pertinent test results  Airway Mallampati: II   Neck ROM: full    Dental   Pulmonary neg pulmonary ROS,    breath sounds clear to auscultation       Cardiovascular negative cardio ROS   Rhythm:regular Rate:Normal     Neuro/Psych Seizures -,     GI/Hepatic   Endo/Other    Renal/GU      Musculoskeletal  (+) Arthritis ,   Abdominal   Peds  Hematology   Anesthesia Other Findings   Reproductive/Obstetrics                             Anesthesia Physical Anesthesia Plan  ASA: II  Anesthesia Plan: General   Post-op Pain Management:    Induction: Intravenous  Airway Management Planned: LMA  Additional Equipment:   Intra-op Plan:   Post-operative Plan:   Informed Consent: I have reviewed the patients History and Physical, chart, labs and discussed the procedure including the risks, benefits and alternatives for the proposed anesthesia with the patient or authorized representative who has indicated his/her understanding and acceptance.     Plan Discussed with: CRNA, Anesthesiologist and Surgeon  Anesthesia Plan Comments:         Anesthesia Quick Evaluation

## 2016-02-06 NOTE — Transfer of Care (Signed)
Immediate Anesthesia Transfer of Care Note  Patient: Eddie Marshall  Procedure(s) Performed: Procedure(s): CYSTOSCOPY WITH URETHRAL DILATATION (N/A) CYSTOSCOPY WITH RETROGRADE URETHROGRAM (N/A)  Patient Location: PACU  Anesthesia Type:General  Level of Consciousness: awake and patient cooperative  Airway & Oxygen Therapy: Patient Spontanous Breathing and Patient connected to face mask oxygen  Post-op Assessment: Report given to RN, Post -op Vital signs reviewed and stable and Patient moving all extremities  Post vital signs: Reviewed and stable  Last Vitals:  Vitals:   02/06/16 1142 02/06/16 1150  BP: 132/88 131/84  Pulse: 67   Resp: 16 11  Temp: 36.6 C     Last Pain:  Vitals:   02/06/16 1142  TempSrc: Oral  PainSc: 2       Patients Stated Pain Goal: 7 (02/06/16 1142)  Complications: No apparent anesthesia complications

## 2016-02-07 NOTE — Op Note (Signed)
Eddie Marshall, Eddie Marshall                 ACCOUNT NO.:  [id number]  MEDICAL RECORD NO.:  [id number]  LOCATION:  [facility]                          FACILITY:  [facility]  PHYSICIAN:  Eddie Seltzer. Annabell Howells, M.D.    DATE OF BIRTH:  May 12, 1980  DATE OF PROCEDURE:  02/06/2016 DATE OF DISCHARGE:  02/06/2016                              OPERATIVE REPORT   PROCEDURES: 1. Retrograde urethrogram with interpretation. 2. Cystoscopy with balloon dilation of urethral stricture.  PREOPERATIVE DIAGNOSIS:  Proximal urethral stricture.  POSTOPERATIVE DIAGNOSIS:  Proximal urethral stricture.  SURGEON:  Eddie Seltzer. Annabell Howells, M.D.  ANESTHESIA:  General.  SPECIMEN:  None.  DRAINS:  An 18-French Council catheter.  BLOOD LOSS:  Minimal.  COMPLICATIONS:  None.  INDICATIONS:  Eddie Marshall is a 36 year old white male, who presented to the office last week with difficulty voiding.  He was found to have severe proximal stricture on cystoscopy and presents today for attempted dilation.  FINDINGS OF PROCEDURE:  He was given 3 g of Ancef.  He was taken to the operating room where general anesthetic was induced.  He was placed in lithotomy position.  His perineum and genitalia were prepped with Betadine solution and he was draped in usual sterile fashion.  A retrograde urethrogram was performed using an asepto syringe and Cystografin. Examination revealed a normal caliber ureter to the bulb, where contrast would not efflux through the stricture into the prostatic urethra.  At this point, the 6.5-French semi-rigid ureteroscope was passed per urethra to allowed access to the area of the stricture.  There was a small false passage in an area of mild-to-moderate stricture ring in the bulb.  The false passage was from his Foley catheter attempted last week.  Proximal to that, there was a pinpoint stricture in the proximal bulb.  I was able to get a Sensor guidewire through the ostia of the stricture into the bladder.  The ureteroscope was  removed and the 15 cm 24-French balloon dilation catheter was passed into the bladder.  Under fluoroscopic guidance, the balloon was dilated to 20 atmospheres.  This was left for 10 minutes, then it was deflated.  A second dilation was performed.  At this point, inspection was performed with a 23-French cystoscope with a 30-degree lens.  Examination revealed a normal anterior urethra to the level of the stricture.  The disrupted stricture was approximately 2 cm in length.  There was some dystrophic calcification at the level of the membranous urethra.  The external sphincter was intact.  The prostatic urethra was short with some bilobar hyperplasia with no obstruction. Examination of bladder revealed a smooth wall with mild erythema.  No tumors or stones were noted.  Ureteral orifices were unremarkable.  The cystoscope was removed and an 18-French Council catheter was passed over the wire into the bladder.  It was rather tight going through.  The wire was removed.  The balloon was filled with 10 mL of sterile fluid and the catheter was placed to straight drainage.  The patient was taken down from lithotomy position.  His anesthesia was reversed.  He was moved to recovery room in stable condition.  We will keep this catheter overnight and  return to see us in the near future for flow rate.     Eddie Marshall, M.D.     JJW/MEDQ  D:  02/06/2016  T:  02/07/2016  Job:  [id number]

## 2016-02-11 ENCOUNTER — Encounter (HOSPITAL_COMMUNITY): Payer: Self-pay | Admitting: Urology

## 2016-02-20 ENCOUNTER — Ambulatory Visit (INDEPENDENT_AMBULATORY_CARE_PROVIDER_SITE_OTHER): Payer: Self-pay | Admitting: Urology

## 2016-02-20 DIAGNOSIS — N359 Urethral stricture, unspecified: Secondary | ICD-10-CM

## 2016-03-26 ENCOUNTER — Ambulatory Visit (INDEPENDENT_AMBULATORY_CARE_PROVIDER_SITE_OTHER): Payer: Self-pay | Admitting: Urology

## 2016-03-26 DIAGNOSIS — N35011 Post-traumatic bulbous urethral stricture: Secondary | ICD-10-CM

## 2016-07-09 ENCOUNTER — Ambulatory Visit: Payer: Self-pay | Admitting: Urology

## 2019-01-10 ENCOUNTER — Other Ambulatory Visit: Payer: Self-pay

## 2019-01-10 DIAGNOSIS — Z20822 Contact with and (suspected) exposure to covid-19: Secondary | ICD-10-CM

## 2019-01-12 LAB — NOVEL CORONAVIRUS, NAA: SARS-CoV-2, NAA: NOT DETECTED

## 2019-01-17 ENCOUNTER — Other Ambulatory Visit: Payer: Self-pay

## 2019-01-17 DIAGNOSIS — Z20822 Contact with and (suspected) exposure to covid-19: Secondary | ICD-10-CM

## 2019-01-18 LAB — NOVEL CORONAVIRUS, NAA: SARS-CoV-2, NAA: DETECTED — AB

## 2019-02-02 ENCOUNTER — Other Ambulatory Visit: Payer: Self-pay | Admitting: Family Medicine

## 2019-02-02 ENCOUNTER — Other Ambulatory Visit (HOSPITAL_COMMUNITY): Payer: Self-pay | Admitting: Family Medicine

## 2019-02-02 DIAGNOSIS — I8002 Phlebitis and thrombophlebitis of superficial vessels of left lower extremity: Secondary | ICD-10-CM

## 2019-02-08 ENCOUNTER — Other Ambulatory Visit: Payer: Self-pay

## 2019-02-08 ENCOUNTER — Ambulatory Visit (HOSPITAL_COMMUNITY)
Admission: RE | Admit: 2019-02-08 | Discharge: 2019-02-08 | Disposition: A | Discharge status: Home / Self Care | Payer: Self-pay | Source: Ambulatory Visit | Attending: Family Medicine | Admitting: Family Medicine

## 2019-02-08 DIAGNOSIS — I8002 Phlebitis and thrombophlebitis of superficial vessels of left lower extremity: Secondary | ICD-10-CM | POA: Insufficient documentation

## 2021-04-10 IMAGING — US US EXTREM LOW VENOUS*L*
1 series · 13 of 24 positions shown · non-contrast
Comparison: NO PRIOR.

CLINICAL DATA: Thrombophlebitis.



[Series 1: us extrem low venous*left* · 0.09mm/px · 13 of 58 slices shown]
[im 1/58]
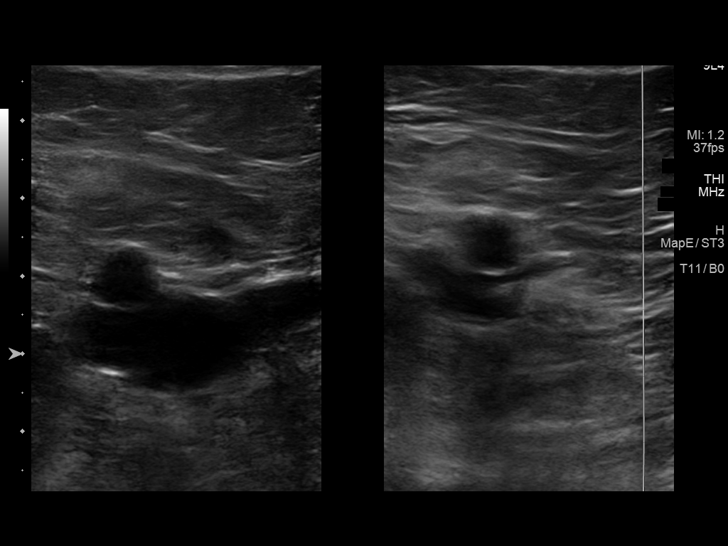
[im 5/58]
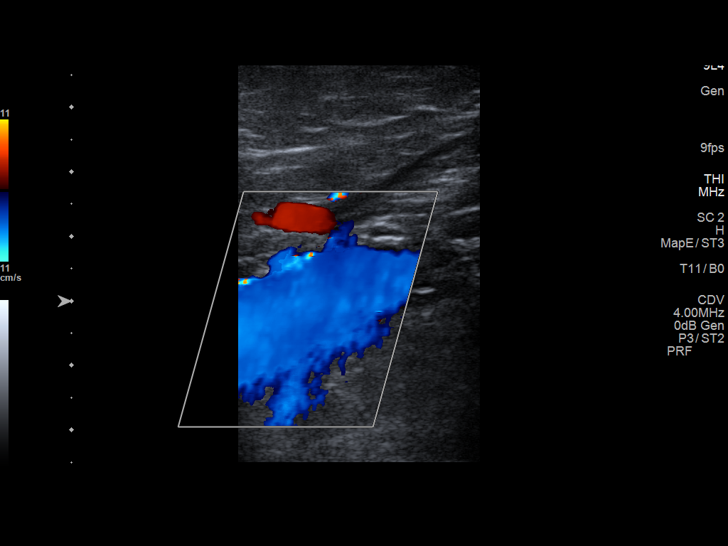
[im 10/58]
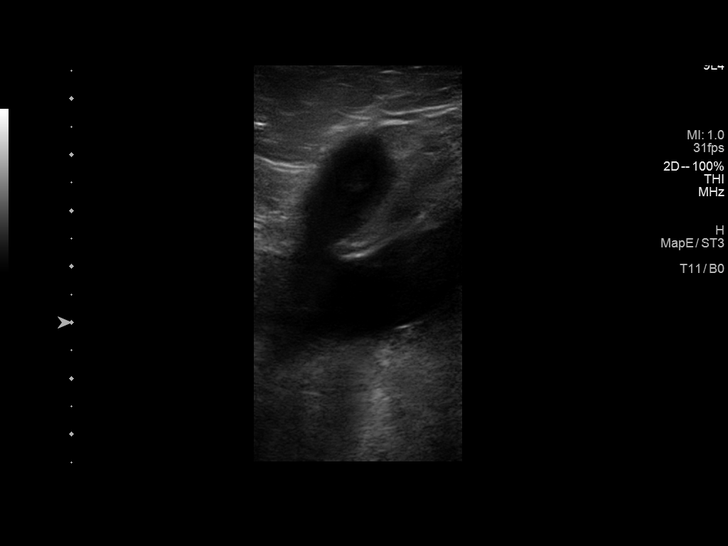
[im 15/58]
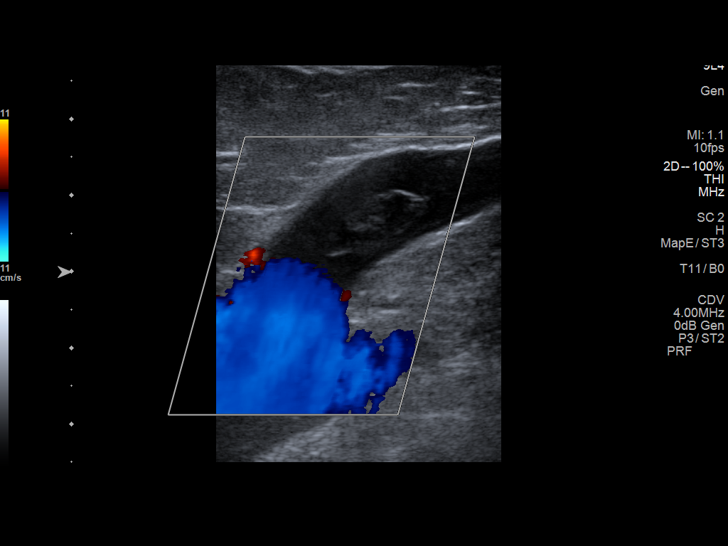
[im 20/58]
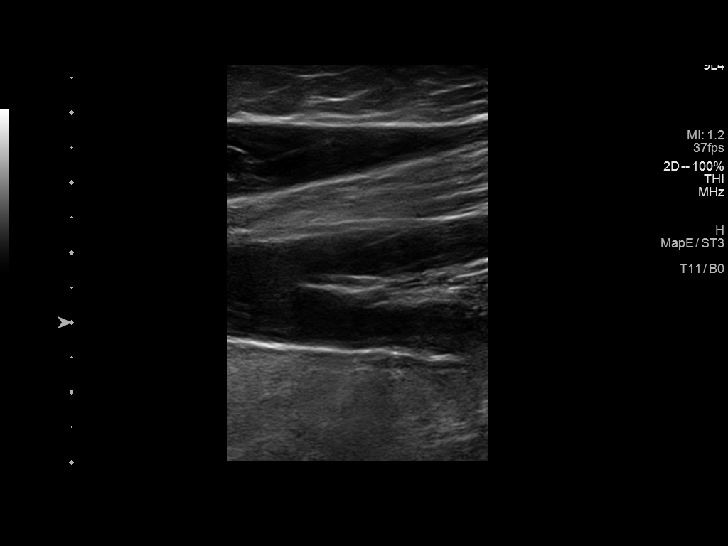
[im 25/58]
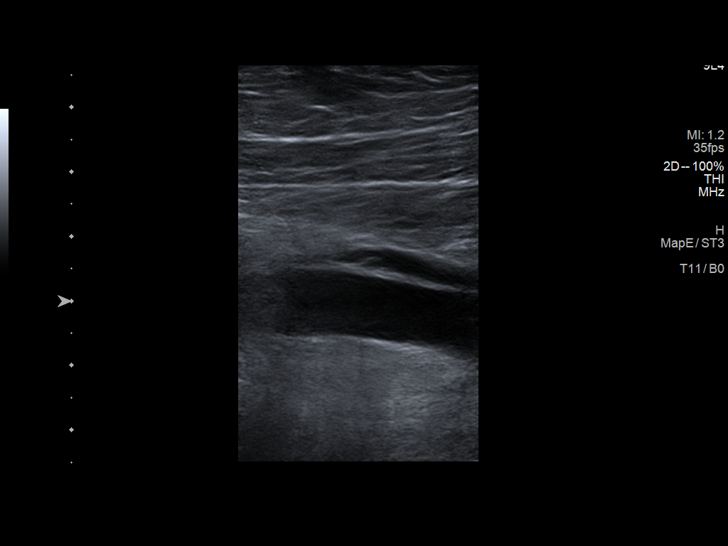
[im 30/58]
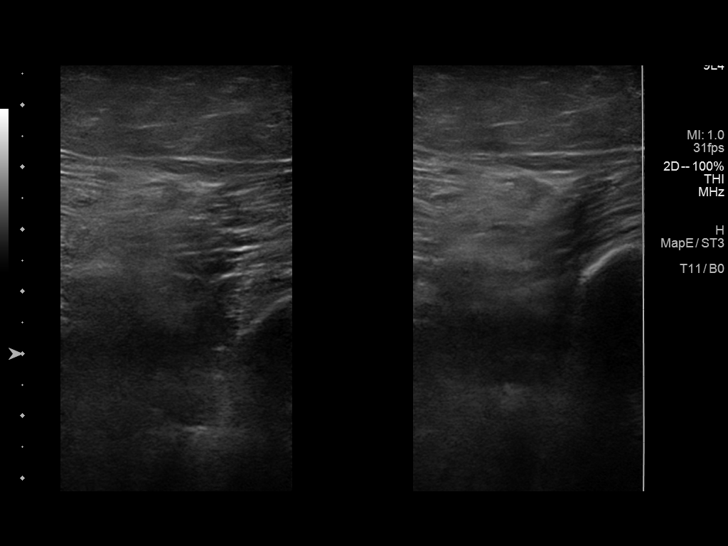
[im 33/58]
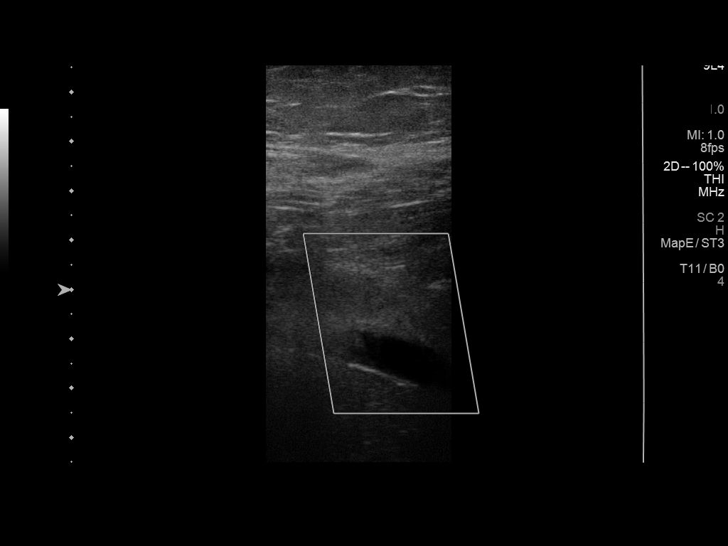
[im 38/58]
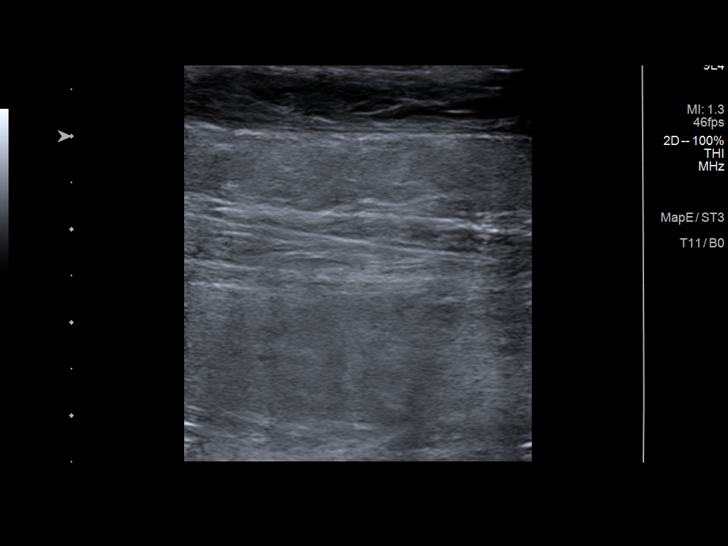
[im 43/58]
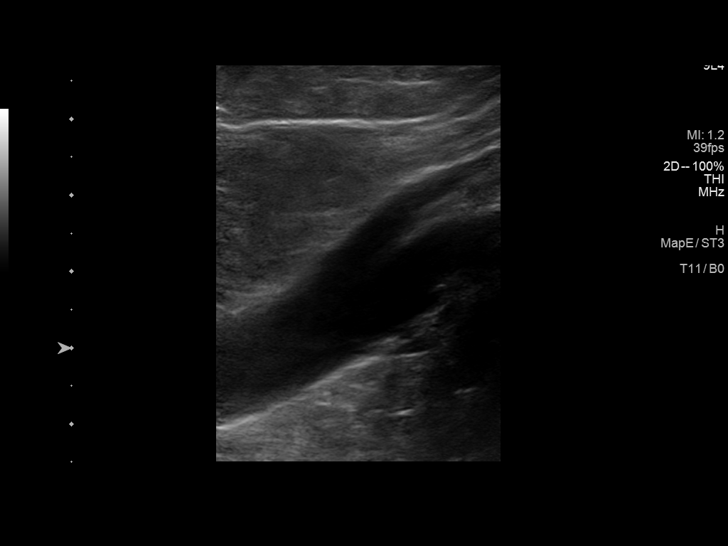
[im 48/58]
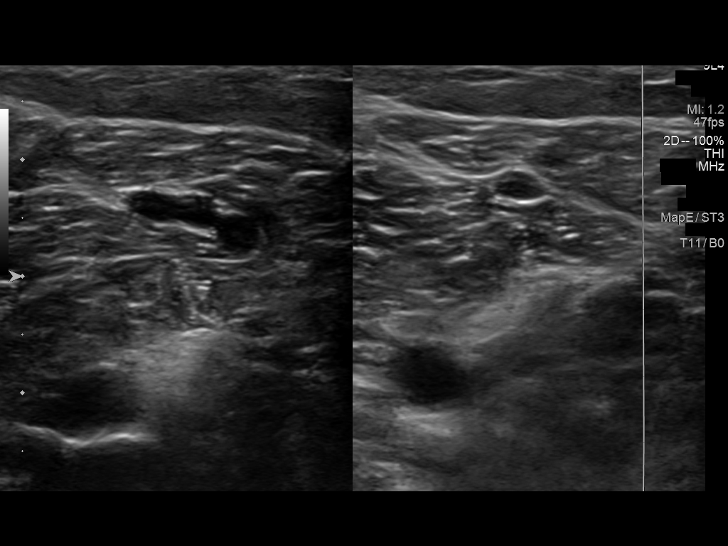
[im 53/58]
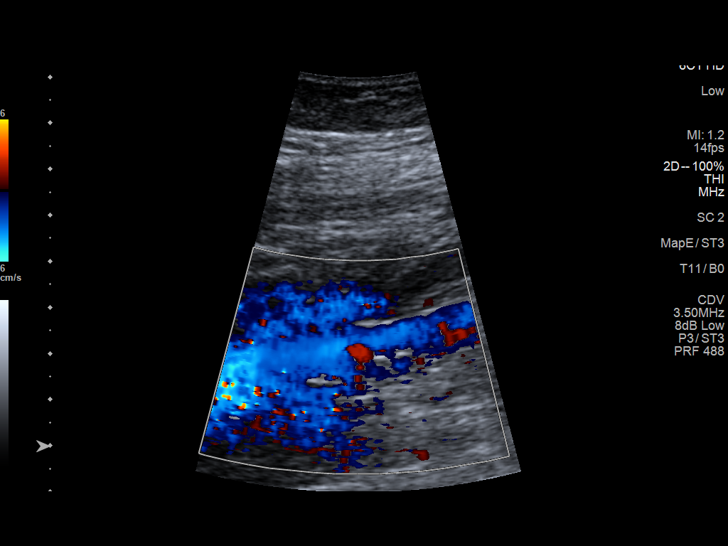
[im 58/58]
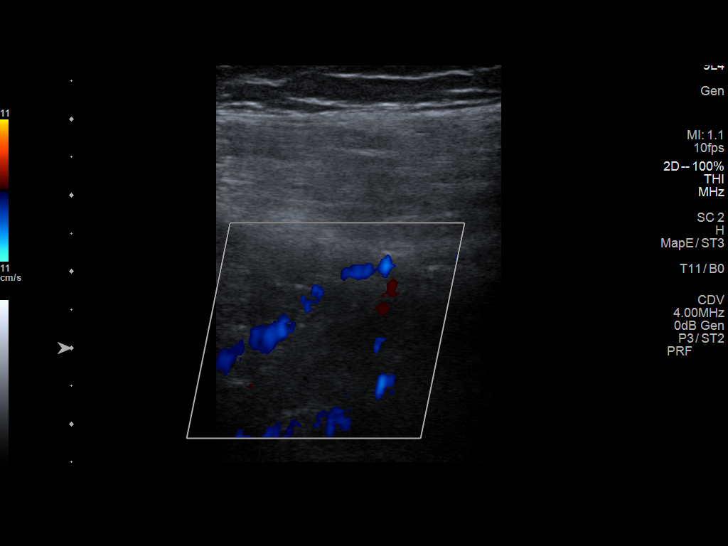

[13 of 24 positions shown; findings below may reference images not displayed]

FINDINGS: Contralateral Common Femoral Vein: Respiratory phasicity is normal
and symmetric with the symptomatic side. No evidence of thrombus.
Normal compressibility.

Common Femoral Vein: No evidence of thrombus. Normal
compressibility, respiratory phasicity and response to augmentation.

Saphenofemoral Junction: No evidence of thrombus. Normal
compressibility and flow on color Doppler imaging.

Profunda Femoral Vein: No evidence of thrombus. Normal
compressibility and flow on color Doppler imaging.

Femoral Vein: No evidence of thrombus. Normal compressibility,
respiratory phasicity and response to augmentation.

Popliteal Vein: No evidence of thrombus. Normal compressibility,
respiratory phasicity and response to augmentation.

Calf Veins: No evidence of thrombus. Normal compressibility and flow
on color Doppler imaging.

Superficial Great Saphenous Vein: Thrombus noted throughout the left
greater saphenous vein in the thigh.

Other Findings:  None.
IMPRESSION: 1. Left greater saphenous vein thrombus consistent with clinical
diagnosis of superficial thrombophlebitis.

2.  No evidence of deep venous thrombosis.

## 2022-07-23 ENCOUNTER — Ambulatory Visit: Payer: Medicaid Other | Admitting: Internal Medicine

## 2022-08-13 ENCOUNTER — Ambulatory Visit: Payer: Medicaid Other | Admitting: Internal Medicine

## 2022-08-13 ENCOUNTER — Encounter: Payer: Self-pay | Admitting: Internal Medicine

## 2022-08-13 VITALS — BP 155/88 | HR 84 | Temp 97.8°F | Ht 67.0 in | Wt 301.0 lb

## 2022-08-13 DIAGNOSIS — Z1159 Encounter for screening for other viral diseases: Secondary | ICD-10-CM

## 2022-08-13 DIAGNOSIS — M160 Bilateral primary osteoarthritis of hip: Secondary | ICD-10-CM

## 2022-08-13 DIAGNOSIS — R42 Dizziness and giddiness: Secondary | ICD-10-CM

## 2022-08-13 DIAGNOSIS — Z131 Encounter for screening for diabetes mellitus: Secondary | ICD-10-CM

## 2022-08-13 DIAGNOSIS — Z114 Encounter for screening for human immunodeficiency virus [HIV]: Secondary | ICD-10-CM

## 2022-08-13 DIAGNOSIS — Z1321 Encounter for screening for nutritional disorder: Secondary | ICD-10-CM

## 2022-08-13 DIAGNOSIS — Z9189 Other specified personal risk factors, not elsewhere classified: Secondary | ICD-10-CM

## 2022-08-13 DIAGNOSIS — E782 Mixed hyperlipidemia: Secondary | ICD-10-CM

## 2022-08-13 DIAGNOSIS — Z0001 Encounter for general adult medical examination with abnormal findings: Secondary | ICD-10-CM | POA: Diagnosis not present

## 2022-08-13 DIAGNOSIS — R053 Chronic cough: Secondary | ICD-10-CM | POA: Insufficient documentation

## 2022-08-13 DIAGNOSIS — R0683 Snoring: Secondary | ICD-10-CM

## 2022-08-13 DIAGNOSIS — Z1329 Encounter for screening for other suspected endocrine disorder: Secondary | ICD-10-CM | POA: Diagnosis not present

## 2022-08-13 DIAGNOSIS — I10 Essential (primary) hypertension: Secondary | ICD-10-CM

## 2022-08-13 MED ORDER — ALBUTEROL SULFATE HFA 108 (90 BASE) MCG/ACT IN AERS: 2.0000 | INHALATION_SPRAY | Freq: Four times a day (QID) | RESPIRATORY_TRACT | 2 refills | Status: DC | PRN

## 2022-08-13 NOTE — Assessment & Plan Note (Signed)
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  He was evaluated by orthopedic surgery (Dr. Everlena Cooper) in January and was told he would need to lose weight.  He does not currently have follow-up scheduled orthopedic surgery and is interested in establishing care with a provider closer to home.  Currently using NSAIDs as needed for pain relief but is interested in an intra-articular corticosteroid injection. -Orthopedic surgery referral placed for [facility]

## 2022-08-13 NOTE — Assessment & Plan Note (Signed)
Previously documented history of hypertension.  He is currently prescribed olmesartan 40 mg daily.  BP was elevated initially, but improved by the end of his appointment.  He endorses episodic dizziness that is triggered by sudden change in position.  Orthostatics were negative today. -No medication changes today.  Follow-up in 4 weeks for reassessment.

## 2022-08-13 NOTE — Assessment & Plan Note (Signed)
Presenting today to establish care.  Available records and labs have been reviewed. -Baseline labs ordered today, including one-time HIV/HCV screenings -Referrals have been placed to pulmonology, orthopedic surgery, and medical nutrition therapy -We will tentatively plan for follow-up in 4 weeks for lab review, symptom reassessment, and to discuss medication options for weight loss

## 2022-08-13 NOTE — Assessment & Plan Note (Signed)
BMI 47.1 today.  He expresses an interest in weight loss in order to improve his surgical candidacy.  His exercise capacity is limited secondary to hip pain.  He is not following any specific diet currently. -Referral to medical nutrition therapy placed today -Baseline labs have been ordered -We will tentatively plan for follow-up in 4 weeks to review labs and discuss appropriate medications for weight loss.  In the interim, I have recommended that he contact his insurance company to see if they will cover Wegovy or Zepbound though I also expressed my concern that neither medication will be covered.

## 2022-08-13 NOTE — Assessment & Plan Note (Signed)
He endorses an episodic persistent, dry cough and requests an albuterol inhaler for as needed symptom relief. -Albuterol prescribed for as needed symptom relief -Referred to pulmonology for OSA evaluation

## 2022-08-13 NOTE — Assessment & Plan Note (Signed)
He endorses recent episodes of dizziness that are associated with sudden changes in position.  Orthostatics were negative today.  No nystagmus appreciated on exam. -Baseline labs ordered today -Follow-up in 4 weeks for reassessment

## 2022-08-13 NOTE — Assessment & Plan Note (Signed)
His sister states that he snores very loudly at night.  He endorses daytime fatigue as well. -Sleep medicine referral placed today for OSA eval

## 2022-08-13 NOTE — Progress Notes (Signed)
New Patient Office Visit  Subjective    Patient ID: Eddie Marshall, male    DOB: 1980/07/22  Age: 42 y.o. MRN: [id number]  CC:  Chief Complaint  Patient presents with   Establish Care    Per pt have dizzy spills off and since age 57 and one recently    HPI Eddie Marshall presents to establish care.  He is a 42 year old male with past medical history significant for HTN, bilateral hip OA, and morbid obesity.  He has most recently received care at [facility] internal medicine.  [name] is accompanied by his sister today and has multiple acute concerns to discuss.  He is interested in losing weight as he is not currently a candidate for hip replacement surgery.  He has recently been evaluated by orthopedic surgery in [facility] and was told that he would need to lose a significant amount of weight to improve his surgical candidacy.  His sister is concerned for an underlying pulmonary disorder as she states that he snores quite loudly at night.  [name] endorses daytime fatigue as well.  His last concern is a persistent cough and he requests an albuterol inhaler for as needed symptom relief.  He currently works at [employer] and denies tobacco, alcohol, and illicit drug use.  His family medical history is significant for dementia, hypertension, and strokes in multiple family members.  Acute concerns, chronic medical conditions, and outstanding preventative care items discussed today are individually addressed A/P below.   Outpatient Encounter Medications as of 08/13/2022  Medication Sig   albuterol (VENTOLIN HFA) 108 (90 Base) MCG/ACT inhaler Inhale 2 puffs into the lungs every 6 (six) hours as needed for wheezing or shortness of breath.   lidocaine (LIDODERM) 5 % Place 1 patch onto the skin daily. Remove & Discard patch within 12 hours or as directed by MD   olmesartan (BENICAR) 40 MG tablet Take 40 mg by mouth daily.   [DISCONTINUED] HYDROcodone-acetaminophen (NORCO) 5-325 MG tablet Take 1 tablet  by mouth every 6 (six) hours as needed for moderate pain. (Patient not taking: Reported on 08/13/2022)   [DISCONTINUED] ibuprofen (ADVIL,MOTRIN) 200 MG tablet Take 400 mg by mouth every 8 (eight) hours as needed (for pain/headache.). (Patient not taking: Reported on 08/13/2022)   [DISCONTINUED] phenazopyridine (PYRIDIUM) 200 MG tablet Take 1 tablet (200 mg total) by mouth 3 (three) times daily. (Patient not taking: Reported on 02/02/2016)   No facility-administered encounter medications on file as of 08/13/2022.    Past Medical History:  Diagnosis Date   Arthritis    Complication of anesthesia    Pt had an epidural that went into his lungs   Hip disease    Hypertension    Seizures (HCC)     Past Surgical History:  Procedure Laterality Date   CYSTOSCOPY WITH RETROGRADE URETHROGRAM N/A 02/06/2016   Procedure: CYSTOSCOPY WITH RETROGRADE URETHROGRAM;  Surgeon: Bjorn Pippin, MD;  Location: [facility];  Service: Urology;  Laterality: N/A;   CYSTOSCOPY WITH URETHRAL DILATATION N/A 02/06/2016   Procedure: CYSTOSCOPY WITH URETHRAL DILATATION;  Surgeon: Bjorn Pippin, MD;  Location: [facility];  Service: Urology;  Laterality: N/A;   HIP SURGERY Left 1993 1995 1996   x 3   KNEE SURGERY Right 1999    Family History  Problem Relation Age of Onset   Diabetes Mother    Hypertension Mother    Hypertension Father     Social History   Socioeconomic History   Marital status: Single  Spouse name: Not on file   Number of children: Not on file   Years of education: Not on file   Highest education level: Not on file  Occupational History   Not on file  Tobacco Use   Smoking status: Never   Smokeless tobacco: Never  Substance and Sexual Activity   Alcohol use: No   Drug use: No   Sexual activity: Yes  Other Topics Concern   Not on file  Social History Narrative   Not on file   Social Determinants of Health   Financial Resource Strain: Not on file  Food Insecurity: Not on file  Transportation  Needs: Not on file  Physical Activity: Not on file  Stress: Not on file  Social Connections: Not on file  Intimate Partner Violence: Not on file   Review of Systems  Constitutional:  Positive for malaise/fatigue. Negative for chills and fever.  HENT:  Negative for sore throat.   Respiratory:  Positive for cough (Persistent, dry). Negative for shortness of breath.   Cardiovascular:  Negative for chest pain, palpitations and leg swelling.  Gastrointestinal:  Negative for abdominal pain, blood in stool, constipation, diarrhea, nausea and vomiting.  Genitourinary:  Negative for dysuria and hematuria.  Musculoskeletal:  Positive for back pain (Lumbar back pain) and joint pain (Bilateral hip pain). Negative for myalgias.  Skin:  Negative for itching and rash.  Neurological:  Negative for dizziness and headaches.  Psychiatric/Behavioral:  Negative for depression and suicidal ideas.    Objective    BP (!) 155/88   Pulse 84   Temp 97.8 F (36.6 C) (Oral)   Ht 5\' 7"  (1.702 m)   Wt (!) 301 lb (136.5 kg)   SpO2 95%   BMI 47.14 kg/m   Physical Exam Vitals reviewed.  Constitutional:      General: He is not in acute distress.    Appearance: Normal appearance. He is obese. He is not ill-appearing.  HENT:     Head: Normocephalic and atraumatic.     Right Ear: External ear normal.     Left Ear: External ear normal.     Nose: Nose normal. No congestion or rhinorrhea.     Mouth/Throat:     Mouth: Mucous membranes are moist.     Pharynx: Oropharynx is clear.  Eyes:     General: No scleral icterus.    Extraocular Movements: Extraocular movements intact.     Conjunctiva/sclera: Conjunctivae normal.     Pupils: Pupils are equal, round, and reactive to light.  Cardiovascular:     Rate and Rhythm: Normal rate and regular rhythm.     Pulses: Normal pulses.     Heart sounds: Normal heart sounds. No murmur heard. Pulmonary:     Effort: Pulmonary effort is normal.     Breath sounds: Normal  breath sounds. No wheezing, rhonchi or rales.  Abdominal:     General: Abdomen is flat. Bowel sounds are normal. There is no distension.     Palpations: Abdomen is soft.     Tenderness: There is no abdominal tenderness.  Musculoskeletal:        General: No swelling or deformity. Normal range of motion.     Cervical back: Normal range of motion.  Skin:    General: Skin is warm and dry.     Capillary Refill: Capillary refill takes less than 2 seconds.  Neurological:     General: No focal deficit present.     Mental Status: He is alert and  oriented to person, place, and time.     Motor: No weakness.  Psychiatric:        Mood and Affect: Mood normal.        Behavior: Behavior normal.        Thought Content: Thought content normal.    Assessment & Plan:   Problem List Items Addressed This Visit       Essential hypertension    Previously documented history of hypertension.  He is currently prescribed olmesartan 40 mg daily.  BP was elevated initially, but improved by the end of his appointment.  He endorses episodic dizziness that is triggered by sudden change in position.  Orthostatics were negative today. -No medication changes today.  Follow-up in 4 weeks for reassessment.      Osteoarthritis of hips, bilateral    Previously documented history of bilateral hip OA ( L > R).  He was evaluated by orthopedic surgery (Dr. Everlena Cooper) in January and was told he would need to lose weight.  He does not currently have follow-up scheduled orthopedic surgery and is interested in establishing care with a provider closer to home.  Currently using NSAIDs as needed for pain relief but is interested in an intra-articular corticosteroid injection. -Orthopedic surgery referral placed for [facility]      Morbid obesity (HCC)    BMI 47.1 today.  He expresses an interest in weight loss in order to improve his surgical candidacy.  His exercise capacity is limited secondary to hip pain.  He is not  following any specific diet currently. -Referral to medical nutrition therapy placed today -Baseline labs have been ordered -We will tentatively plan for follow-up in 4 weeks to review labs and discuss appropriate medications for weight loss.  In the interim, I have recommended that he contact his insurance company to see if they will cover Wegovy or Zepbound though I also expressed my concern that neither medication will be covered.      Snoring    His sister states that he snores very loudly at night.  He endorses daytime fatigue as well. -Sleep medicine referral placed today for OSA eval      Persistent cough    He endorses an episodic persistent, dry cough and requests an albuterol inhaler for as needed symptom relief. -Albuterol prescribed for as needed symptom relief -Referred to pulmonology for OSA evaluation      Dizziness    He endorses recent episodes of dizziness that are associated with sudden changes in position.  Orthostatics were negative today.  No nystagmus appreciated on exam. -Baseline labs ordered today -Follow-up in 4 weeks for reassessment      Encounter for general adult medical examination with abnormal findings - Primary    Presenting today to establish care.  Available records and labs have been reviewed. -Baseline labs ordered today, including one-time HIV/HCV screenings -Referrals have been placed to pulmonology, orthopedic surgery, and medical nutrition therapy -We will tentatively plan for follow-up in 4 weeks for lab review, symptom reassessment, and to discuss medication options for weight loss      Return in about 4 weeks (around 09/10/2022) for lab / medication review, discuss weight loss.   Billie Lade, MD

## 2022-08-13 NOTE — Patient Instructions (Signed)
It was a pleasure to see you today.  Thank you for giving [place] the opportunity to be involved in your care.  Below is a brief recap of your visit and next steps.  We will plan to see you again in 4 weeks.  Summary You have established care today We will check basic labs You have been referred to a nutritionist, sleep medicine, and orthopedic surgery We will follow up in 4 weeks to review labs and discuss appropriate medications for weight loss Please contact to your insurance company to see if they will cover Wegovy or Zepbound

## 2022-08-17 ENCOUNTER — Telehealth: Payer: Self-pay | Admitting: Internal Medicine

## 2022-08-17 ENCOUNTER — Other Ambulatory Visit: Payer: Self-pay | Admitting: Internal Medicine

## 2022-08-17 DIAGNOSIS — E559 Vitamin D deficiency, unspecified: Secondary | ICD-10-CM

## 2022-08-17 LAB — CBC WITH DIFFERENTIAL/PLATELET
Basophils Absolute: 0.1 10*3/uL (ref 0.0–0.2)
Basos: 1 %
EOS (ABSOLUTE): 0.1 10*3/uL (ref 0.0–0.4)
Eos: 1 %
Hematocrit: 48.8 % (ref 37.5–51.0)
Hemoglobin: 16.2 g/dL (ref 13.0–17.7)
Immature Grans (Abs): 0 10*3/uL (ref 0.0–0.1)
Immature Granulocytes: 0 %
Lymphocytes Absolute: 2.2 10*3/uL (ref 0.7–3.1)
Lymphs: 26 %
MCH: 29.3 pg (ref 26.6–33.0)
MCHC: 33.2 g/dL (ref 31.5–35.7)
MCV: 88 fL (ref 79–97)
Monocytes Absolute: 0.7 10*3/uL (ref 0.1–0.9)
Monocytes: 9 %
Neutrophils Absolute: 5.2 10*3/uL (ref 1.4–7.0)
Neutrophils: 63 %
Platelets: 261 10*3/uL (ref 150–450)
RBC: 5.52 x10E6/uL (ref 4.14–5.80)
RDW: 12.9 % (ref 11.6–15.4)
WBC: 8.3 10*3/uL (ref 3.4–10.8)

## 2022-08-17 LAB — CMP14+EGFR
ALT: 23 IU/L (ref 0–44)
AST: 17 IU/L (ref 0–40)
Albumin/Globulin Ratio: 1.6 (ref 1.2–2.2)
Albumin: 4.5 g/dL (ref 4.1–5.1)
Alkaline Phosphatase: 68 IU/L (ref 44–121)
BUN/Creatinine Ratio: 21 — ABNORMAL HIGH (ref 9–20)
BUN: 16 mg/dL (ref 6–24)
Bilirubin Total: 1.5 mg/dL — ABNORMAL HIGH (ref 0.0–1.2)
CO2: 22 mmol/L (ref 20–29)
Calcium: 9.2 mg/dL (ref 8.7–10.2)
Chloride: 101 mmol/L (ref 96–106)
Creatinine, Ser: 0.76 mg/dL (ref 0.76–1.27)
Globulin, Total: 2.9 g/dL (ref 1.5–4.5)
Glucose: 99 mg/dL (ref 70–99)
Potassium: 3.5 mmol/L (ref 3.5–5.2)
Sodium: 141 mmol/L (ref 134–144)
Total Protein: 7.4 g/dL (ref 6.0–8.5)
eGFR: 114 mL/min/{1.73_m2} (ref >59)

## 2022-08-17 LAB — LIPID PANEL
Chol/HDL Ratio: 3.2 ratio (ref 0.0–5.0)
Cholesterol, Total: 123 mg/dL (ref 100–199)
HDL: 38 mg/dL — ABNORMAL LOW (ref >39)
LDL Chol Calc (NIH): 72 mg/dL (ref 0–99)
Triglycerides: 59 mg/dL (ref 0–149)
VLDL Cholesterol Cal: 13 mg/dL (ref 5–40)

## 2022-08-17 LAB — TSH+FREE T4
Free T4: 1.15 ng/dL (ref 0.82–1.77)
TSH: 2.43 u[IU]/mL (ref 0.450–4.500)

## 2022-08-17 LAB — HCV INTERPRETATION

## 2022-08-17 LAB — HCV AB W REFLEX TO QUANT PCR: HCV Ab: NONREACTIVE

## 2022-08-17 LAB — HIV ANTIBODY (ROUTINE TESTING W REFLEX): HIV Screen 4th Generation wRfx: NONREACTIVE

## 2022-08-17 LAB — HEMOGLOBIN A1C
Est. average glucose Bld gHb Est-mCnc: 114 mg/dL
Hgb A1c MFr Bld: 5.6 % (ref 4.8–5.6)

## 2022-08-17 LAB — B12 AND FOLATE PANEL
Folate: 8.3 ng/mL (ref >3.0)
Vitamin B-12: 322 pg/mL (ref 232–1245)

## 2022-08-17 LAB — VITAMIN D 25 HYDROXY (VIT D DEFICIENCY, FRACTURES): Vit D, 25-Hydroxy: 12.2 ng/mL — ABNORMAL LOW (ref 30.0–100.0)

## 2022-08-17 MED ORDER — VITAMIN D (ERGOCALCIFEROL) 1.25 MG (50000 UNIT) PO CAPS: 50000.0000 [IU] | ORAL_CAPSULE | ORAL | 0 refills | Status: DC

## 2022-08-17 NOTE — Telephone Encounter (Signed)
Patient taking vitamin D does patient need to take with food or how often does he take this every day? Or every week? Patient asked to Call 1:00 or late

## 2022-08-17 NOTE — Telephone Encounter (Signed)
Returned patient call.

## 2022-08-30 ENCOUNTER — Telehealth: Payer: Self-pay | Admitting: Internal Medicine

## 2022-08-30 ENCOUNTER — Other Ambulatory Visit: Payer: Self-pay

## 2022-08-30 DIAGNOSIS — E559 Vitamin D deficiency, unspecified: Secondary | ICD-10-CM

## 2022-08-30 MED ORDER — OLMESARTAN MEDOXOMIL 40 MG PO TABS: 40.0000 mg | ORAL_TABLET | Freq: Every day | ORAL | 0 refills | Status: DC

## 2022-08-30 MED ORDER — VITAMIN D (ERGOCALCIFEROL) 1.25 MG (50000 UNIT) PO CAPS: 50000.0000 [IU] | ORAL_CAPSULE | ORAL | 0 refills | Status: AC

## 2022-08-30 NOTE — Telephone Encounter (Signed)
Refills sent. Patient aware.  

## 2022-08-30 NOTE — Telephone Encounter (Signed)
Patient called said missed placed his blood pressure medicine and vitamin D, needs a refill.  Patient said he spray house for flea and think in truck or left in house and could not find the medication.   olmesartan (BENICAR) 40 MG tablet [[id number]]   Vitamin D, Ergocalciferol, (DRISDOL) 1.25 MG (50000 UNIT) CAPS capsule [[id number]]   Pharmacy: [facility]

## 2022-08-31 ENCOUNTER — Telehealth: Payer: Self-pay | Admitting: Internal Medicine

## 2022-08-31 NOTE — Telephone Encounter (Signed)
Patient called asked for a nurse to return his call he would like to know the shots that Dr Durwin Nora mentioned about weight lost so he can check with his insurance company to see if covered or not. Call back # [phone]

## 2022-08-31 NOTE — Telephone Encounter (Signed)
Spoke with patient.

## 2022-09-09 ENCOUNTER — Ambulatory Visit: Payer: Medicaid Other | Admitting: Internal Medicine

## 2022-09-09 ENCOUNTER — Encounter: Payer: Self-pay | Admitting: Internal Medicine

## 2022-09-09 DIAGNOSIS — R0683 Snoring: Secondary | ICD-10-CM | POA: Diagnosis not present

## 2022-09-09 DIAGNOSIS — L304 Erythema intertrigo: Secondary | ICD-10-CM

## 2022-09-09 DIAGNOSIS — E559 Vitamin D deficiency, unspecified: Secondary | ICD-10-CM

## 2022-09-09 DIAGNOSIS — M160 Bilateral primary osteoarthritis of hip: Secondary | ICD-10-CM

## 2022-09-09 MED ORDER — SEMAGLUTIDE-WEIGHT MANAGEMENT 0.25 MG/0.5ML ~~LOC~~ SOAJ: 0.2500 mg | SUBCUTANEOUS | 0 refills | Status: AC

## 2022-09-09 MED ORDER — CLOTRIMAZOLE 1 % EX CREA: 1.0000 | TOPICAL_CREAM | Freq: Two times a day (BID) | CUTANEOUS | 2 refills | Status: DC

## 2022-09-09 NOTE — Patient Instructions (Signed)
It was a pleasure to see you today.  Thank you for giving [place] the opportunity to be involved in your care.  Below is a brief recap of your visit and next steps.  We will plan to see you again in 3 months.  Summary Clotrimazole cream prescribed for fungal rash in your groin Wegovy prescribed for weight loss. Please let us know if this is covered. Repeat labs next week. Follow up in 3 months

## 2022-09-09 NOTE — Progress Notes (Signed)
Established Patient Office Visit  Subjective   Patient ID: Eddie Marshall, male    DOB: February 09, 1981  Age: 42 y.o. MRN: [id number]  Chief Complaint  Patient presents with   Results    Lab review   Weight Loss    Go over options   [name] returns to care today for 4-week follow-up for lab/medication review and to discuss medication options for weight loss.  He was last evaluated by me on 4/26 as a new patient presenting to establish care.  He was referred to pulmonology at that time for OSA evaluation.  He was also referred to orthopedic surgery to establish care closer to home in the setting of bilateral hip OA.  There have been no acute interval events.  [name] reports feeling well today.  He is asymptomatic and has no additional concerns to discuss.  Past Medical History:  Diagnosis Date   Arthritis    Complication of anesthesia    Pt had an epidural that went into his lungs   Hip disease    Hypertension    Seizures (HCC)    Past Surgical History:  Procedure Laterality Date   CYSTOSCOPY WITH RETROGRADE URETHROGRAM N/A 02/06/2016   Procedure: CYSTOSCOPY WITH RETROGRADE URETHROGRAM;  Surgeon: Bjorn Pippin, MD;  Location: [facility];  Service: Urology;  Laterality: N/A;   CYSTOSCOPY WITH URETHRAL DILATATION N/A 02/06/2016   Procedure: CYSTOSCOPY WITH URETHRAL DILATATION;  Surgeon: Bjorn Pippin, MD;  Location: [facility];  Service: Urology;  Laterality: N/A;   HIP SURGERY Left 1993 1995 1996   x 3   KNEE SURGERY Right 1999   Social History   Tobacco Use   Smoking status: Never   Smokeless tobacco: Never  Substance Use Topics   Alcohol use: No   Drug use: No   Family History  Problem Relation Age of Onset   Diabetes Mother    Hypertension Mother    Hypertension Father    Allergies  Allergen Reactions   Other Anaphylaxis    EPIDURAL ANESTHESIA     Review of Systems  Constitutional:  Negative for chills and fever.  HENT:  Negative for sore throat.   Respiratory:  Negative  for cough and shortness of breath.   Cardiovascular:  Negative for chest pain, palpitations and leg swelling.  Gastrointestinal:  Negative for abdominal pain, blood in stool, constipation, diarrhea, nausea and vomiting.  Genitourinary:  Negative for dysuria and hematuria.  Musculoskeletal:  Negative for myalgias.  Skin:  Negative for itching and rash.  Neurological:  Negative for dizziness and headaches.  Psychiatric/Behavioral:  Negative for depression and suicidal ideas.      Objective:     BP 128/78   Pulse 73   Ht 5' 9.5" (1.765 m)   Wt 293 lb 6.4 oz (133.1 kg)   SpO2 95%   BMI 42.71 kg/m  BP Readings from Last 3 Encounters:  09/09/22 128/78  08/13/22 (!) 155/88  02/06/16 131/90   Physical Exam Vitals reviewed.  Constitutional:      General: He is not in acute distress.    Appearance: Normal appearance. He is obese. He is not ill-appearing.  HENT:     Head: Normocephalic and atraumatic.     Right Ear: External ear normal.     Left Ear: External ear normal.     Nose: Nose normal. No congestion or rhinorrhea.     Mouth/Throat:     Mouth: Mucous membranes are moist.     Pharynx: Oropharynx is  clear.  Eyes:     General: No scleral icterus.    Extraocular Movements: Extraocular movements intact.     Conjunctiva/sclera: Conjunctivae normal.     Pupils: Pupils are equal, round, and reactive to light.  Cardiovascular:     Rate and Rhythm: Normal rate and regular rhythm.     Pulses: Normal pulses.     Heart sounds: Normal heart sounds. No murmur heard. Pulmonary:     Effort: Pulmonary effort is normal.     Breath sounds: Normal breath sounds. No wheezing, rhonchi or rales.  Abdominal:     General: Abdomen is flat. Bowel sounds are normal. There is no distension.     Palpations: Abdomen is soft.     Tenderness: There is no abdominal tenderness.  Musculoskeletal:        General: No swelling or deformity. Normal range of motion.     Cervical back: Normal range of  motion.  Skin:    General: Skin is warm and dry.     Capillary Refill: Capillary refill takes less than 2 seconds.     Findings: Rash (Intertrigo noted on exam today) present.  Neurological:     General: No focal deficit present.     Mental Status: He is alert and oriented to person, place, and time.     Motor: No weakness.  Psychiatric:        Mood and Affect: Mood normal.        Behavior: Behavior normal.        Thought Content: Thought content normal.   Last CBC Lab Results  Component Value Date   WBC 8.3 08/16/2022   HGB 16.2 08/16/2022   HCT 48.8 08/16/2022   MCV 88 08/16/2022   MCH 29.3 08/16/2022   RDW 12.9 08/16/2022   PLT 261 08/16/2022   Last metabolic panel Lab Results  Component Value Date   GLUCOSE 99 08/16/2022   NA 141 08/16/2022   K 3.5 08/16/2022   CL 101 08/16/2022   CO2 22 08/16/2022   BUN 16 08/16/2022   CREATININE 0.76 08/16/2022   EGFR 114 08/16/2022   CALCIUM 9.2 08/16/2022   PROT 7.4 08/16/2022   ALBUMIN 4.5 08/16/2022   LABGLOB 2.9 08/16/2022   AGRATIO 1.6 08/16/2022   BILITOT 1.5 (H) 08/16/2022   ALKPHOS 68 08/16/2022   AST 17 08/16/2022   ALT 23 08/16/2022   ANIONGAP 5 01/21/2016   Last lipids Lab Results  Component Value Date   CHOL 123 08/16/2022   HDL 38 (L) 08/16/2022   LDLCALC 72 08/16/2022   TRIG 59 08/16/2022   CHOLHDL 3.2 08/16/2022   Last hemoglobin A1c Lab Results  Component Value Date   HGBA1C 5.6 08/16/2022   Last thyroid functions Lab Results  Component Value Date   TSH 2.430 08/16/2022   Last vitamin D Lab Results  Component Value Date   VD25OH 12.2 (L) 08/16/2022   Last vitamin B12 and Folate Lab Results  Component Value Date   VITAMINB12 322 08/16/2022   FOLATE 8.3 08/16/2022      Assessment & Plan:   Problem List Items Addressed This Visit       Intertrigo    Clotrimazole cream prescribed for twice daily application      Morbid obesity (HCC)    Returning to care today for weight loss  management.  BMI 42.7 today.  He has lost 8 pounds since his last appointment after making significant dietary changes..  Previously referred to medical nutrition  therapy.  Has appointment scheduled for 6/3.  He is interested in starting medication for weight loss as his exercise capacity is limited secondary to bilateral hip OA.  Is imperative that he lose weight in order to improve his surgical candidacy. -Through shared decision making, Wegovy 0.25 mg weekly has been prescribed today.  Mr. Gabor has researched semaglutide and would like to start this medication if possible.  I have expressed my concern that his insurance may not cover this medication.  If this is the case, he is in agreement with starting Qsymia. -We will tentatively plan for follow-up in 3 months for reassessment      Vitamin D deficiency    Noted on recent labs.  He is currently prescribed high-dose, weekly vitamin D supplementation x 12 weeks. -Repeat vitamin D level upon completion of weekly vitamin D supplementation.      Hyperbilirubinemia - Primary    Total bilirubin level elevated at 1.5 on labs from April.  No jaundice appreciated on exam.  Labs not concerning for hemolytic process. -Repeat CMP and direct bilirubin level ordered today      Return in about 3 months (around 12/10/2022).   Billie Lade, MD

## 2022-09-15 ENCOUNTER — Encounter: Payer: Self-pay | Admitting: Internal Medicine

## 2022-09-15 DIAGNOSIS — E559 Vitamin D deficiency, unspecified: Secondary | ICD-10-CM | POA: Insufficient documentation

## 2022-09-15 DIAGNOSIS — L304 Erythema intertrigo: Secondary | ICD-10-CM | POA: Insufficient documentation

## 2022-09-15 NOTE — Assessment & Plan Note (Signed)
Total bilirubin level elevated at 1.5 on labs from April.  No jaundice appreciated on exam.  Labs not concerning for hemolytic process. -Repeat CMP and direct bilirubin level ordered today

## 2022-09-15 NOTE — Assessment & Plan Note (Signed)
Noted on recent labs.  He is currently prescribed high-dose, weekly vitamin D supplementation x 12 weeks. -Repeat vitamin D level upon completion of weekly vitamin D supplementation.

## 2022-09-15 NOTE — Assessment & Plan Note (Signed)
Clotrimazole cream prescribed for twice daily application

## 2022-09-15 NOTE — Assessment & Plan Note (Signed)
Returning to care today for weight loss management.  BMI 42.7 today.  He has lost 8 pounds since his last appointment after making significant dietary changes..  Previously referred to medical nutrition therapy.  Has appointment scheduled for 6/3.  He is interested in starting medication for weight loss as his exercise capacity is limited secondary to bilateral hip OA.  Is imperative that he lose weight in order to improve his surgical candidacy. -Through shared decision making, Wegovy 0.25 mg weekly has been prescribed today.  Eddie Marshall has researched semaglutide and would like to start this medication if possible.  I have expressed my concern that his insurance may not cover this medication.  If this is the case, he is in agreement with starting Qsymia. -We will tentatively plan for follow-up in 3 months for reassessment

## 2022-09-17 LAB — CMP14+EGFR
ALT: 23 IU/L (ref 0–44)
AST: 21 IU/L (ref 0–40)
Albumin/Globulin Ratio: 1.6 (ref 1.2–2.2)
Albumin: 4.4 g/dL (ref 4.1–5.1)
Alkaline Phosphatase: 57 IU/L (ref 44–121)
BUN/Creatinine Ratio: 18 (ref 9–20)
BUN: 14 mg/dL (ref 6–24)
Bilirubin Total: 1.4 mg/dL — ABNORMAL HIGH (ref 0.0–1.2)
CO2: 25 mmol/L (ref 20–29)
Calcium: 9.3 mg/dL (ref 8.7–10.2)
Chloride: 104 mmol/L (ref 96–106)
Creatinine, Ser: 0.77 mg/dL (ref 0.76–1.27)
Globulin, Total: 2.8 g/dL (ref 1.5–4.5)
Glucose: 88 mg/dL (ref 70–99)
Potassium: 4.1 mmol/L (ref 3.5–5.2)
Sodium: 142 mmol/L (ref 134–144)
Total Protein: 7.2 g/dL (ref 6.0–8.5)
eGFR: 113 mL/min/{1.73_m2} (ref >59)

## 2022-09-17 LAB — BILIRUBIN, DIRECT: Bilirubin, Direct: 0.33 mg/dL (ref 0.00–0.40)

## 2022-09-20 ENCOUNTER — Encounter: Payer: Medicaid Other | Attending: Internal Medicine | Admitting: Nutrition

## 2022-09-20 ENCOUNTER — Encounter: Payer: Self-pay | Admitting: Nutrition

## 2022-09-20 VITALS — Ht 67.0 in | Wt 295.0 lb

## 2022-09-20 DIAGNOSIS — I10 Essential (primary) hypertension: Secondary | ICD-10-CM | POA: Diagnosis present

## 2022-09-20 DIAGNOSIS — E559 Vitamin D deficiency, unspecified: Secondary | ICD-10-CM | POA: Diagnosis present

## 2022-09-20 NOTE — Progress Notes (Signed)
Medical Nutrition Therapy  Appointment Start time:  1400  Appointment End time:  1500  Primary concerns today: Obesity  Referral diagnosis: E 66.9 Preferred learning style: No preference  Learning readiness: Contemplating   NUTRITION ASSESSMENT  42 yr old wmale referred for weight loss. PCP Dr. Durwin Nora. He is being treated for VIt D deficiency. Being evaluated soon for sleep apnea. He notes he goes to bed tired and wakes up tired. Snores loudly. Lacks energy to do a lot due to chronic fatigue. He notes has cut back a lot on sodas since seeing MD. Has been drinking a lot more water now. Has lost 6 lbs in the last month. He notes he doesn't like any fruits and vegetables due to taste. He doesn't cook. Eats mostly all fast foods. Eats 1-2 meals per day Works part time at [employer]. Willing to try to start swimming at the [facility] for exercise. Willing to try to incorporate healthier foods in his diet slowly.  Current diet is highly processed due to food preferences.  His insurance will not cover weight loss medications.  Wt Readings from Last 3 Encounters:  09/20/22 295 lb (133.8 kg)  09/09/22 293 lb 6.4 oz (133.1 kg)  08/13/22 (!) 301 lb (136.5 kg)   Ht Readings from Last 3 Encounters:  09/09/22 5' 9.5" (1.765 m)  08/13/22 5\' 7"  (1.702 m)  02/06/16 5\' 7"  (1.702 m)   Body mass index is 42.94 kg/m. @BMIFA @ Facility age limit for growth %iles is 20 years. Facility age limit for growth %iles is 20 years.  Clinical Medical Hx: See chart Medications: See chart Labs:  Lab Results  Component Value Date   HGBA1C 5.6 08/16/2022      Latest Ref Rng & Units 09/16/2022   11:26 AM 08/16/2022   11:28 AM 01/21/2016    5:14 PM  CMP  Glucose 70 - 99 mg/dL 88  99  79   BUN 6 - 24 mg/dL 14  16  13    Creatinine 0.76 - 1.27 mg/dL 1.61  0.96  0.45   Sodium 134 - 144 mmol/L 142  141  139   Potassium 3.5 - 5.2 mmol/L 4.1  3.5  3.7   Chloride 96 - 106 mmol/L 104  101  104   CO2 20 - 29 mmol/L 25   22  30    Calcium 8.7 - 10.2 mg/dL 9.3  9.2  8.8   Total Protein 6.0 - 8.5 g/dL 7.2  7.4    Total Bilirubin 0.0 - 1.2 mg/dL 1.4  1.5    Alkaline Phos 44 - 121 IU/L 57  68    AST 0 - 40 IU/L 21  17    ALT 0 - 44 IU/L 23  23    Lipid Panel     Component Value Date/Time   CHOL 123 08/16/2022 1128   TRIG 59 08/16/2022 1128   HDL 38 (L) 08/16/2022 1128   CHOLHDL 3.2 08/16/2022 1128   LDLCALC 72 08/16/2022 1128   LABVLDL 13 08/16/2022 1128    Notable Signs/Symptoms: Chronic fatigue  Lifestyle & Dietary Hx Lives with his sister and nieces, Works at [employer] for 20 hrs a week.  Estimated daily fluid intake: 5-6 -16 oz Supplements: VIt D supplement Sleep: stays tired Stress / self-care: no problems. Current average weekly physical activity: ADL due to being so tired.  24-Hr Dietary Recall Eats 1-2 meals per day. Primarily fast food due to not cooking and not liking a lot of foods  of fruits, vegetables.  Estimated Energy Needs Calories: 1800 Carbohydrate: 200g Protein: 135g Fat: 50g   NUTRITION DIAGNOSIS  NI-1.5 Excessive energy intake As related to Fast food diet.  As evidenced by BMI 42.   NUTRITION INTERVENTION  Nutrition education (E-1) on the following topics:  Lifestyle Medicine  - Whole Food, Plant Predominant Nutrition is highly recommended: Eat Plenty of vegetables, Mushrooms, fruits, Legumes, Whole Grains, Nuts, seeds in lieu of processed meats, processed snacks/pastries red meat, poultry, eggs.    -It is better to avoid simple carbohydrates including: Cakes, Sweet Desserts, Ice Cream, Soda (diet and regular), Sweet Tea, Candies, Chips, Cookies, Store Bought Juices, Alcohol in Excess of  1-2 drinks a day, Lemonade,  Artificial Sweeteners, Doughnuts, Coffee Creamers, "Sugar-free" Products, etc, etc.  This is not a complete list.....  Exercise: If you are able: 30 -60 minutes a day ,4 days a week, or 150 minutes a week.  The longer the better.  Combine stretch, strength,  and aerobic activities.  If you were told in the past that you have high risk for cardiovascular diseases, you may seek evaluation by your heart doctor prior to initiating moderate to intense exercise programs.   Handouts Provided Include  My Plate Lifestyle Medicine  Learning Style & Readiness for Change Teaching method utilized: Visual & Auditory  Demonstrated degree of understanding via: Teach Back  Barriers to learning/adherence to lifestyle change: willingness to change  Goals Established by Pt Goals  Try to work on eating some fruit or vegetables Look into Healthy Choice meals Look into going to the [facility] and exercising in the water 2-3 times per week   MONITORING & EVALUATION Dietary intake, weekly physical activity, and weight in 1 month.  Next Steps  Patient is to work on eating 3 better balanced meals and cut out fast food.Marland Kitchen

## 2022-09-20 NOTE — Patient Instructions (Addendum)
Goals  Try to work on eating some fruit or vegetables Look into Healthy Choice meals Look into going to the [facility] and exercising in the water 2-3 times per week

## 2022-09-23 ENCOUNTER — Ambulatory Visit: Payer: Medicaid Other | Admitting: Orthopedic Surgery

## 2022-09-23 ENCOUNTER — Other Ambulatory Visit (INDEPENDENT_AMBULATORY_CARE_PROVIDER_SITE_OTHER): Payer: Medicaid Other

## 2022-09-23 VITALS — BP 148/80 | Ht 69.5 in | Wt 292.0 lb

## 2022-09-23 DIAGNOSIS — M9111 Juvenile osteochondrosis of head of femur [Legg-Calve-Perthes], right leg: Secondary | ICD-10-CM

## 2022-09-23 DIAGNOSIS — M1612 Unilateral primary osteoarthritis, left hip: Secondary | ICD-10-CM

## 2022-09-23 DIAGNOSIS — M160 Bilateral primary osteoarthritis of hip: Secondary | ICD-10-CM

## 2022-09-23 DIAGNOSIS — M1611 Unilateral primary osteoarthritis, right hip: Secondary | ICD-10-CM

## 2022-09-23 DIAGNOSIS — Z6841 Body Mass Index (BMI) 40.0 and over, adult: Secondary | ICD-10-CM

## 2022-09-23 DIAGNOSIS — M9112 Juvenile osteochondrosis of head of femur [Legg-Calve-Perthes], left leg: Secondary | ICD-10-CM

## 2022-09-23 DIAGNOSIS — M5136 Other intervertebral disc degeneration, lumbar region: Secondary | ICD-10-CM | POA: Diagnosis not present

## 2022-09-23 DIAGNOSIS — M25552 Pain in left hip: Secondary | ICD-10-CM | POA: Diagnosis not present

## 2022-09-23 DIAGNOSIS — M25551 Pain in right hip: Secondary | ICD-10-CM

## 2022-09-23 DIAGNOSIS — Z96649 Presence of unspecified artificial hip joint: Secondary | ICD-10-CM

## 2022-09-23 MED ORDER — MELOXICAM 7.5 MG PO TABS: 7.5000 mg | ORAL_TABLET | Freq: Every day | ORAL | 5 refills | Status: DC

## 2022-09-23 NOTE — Progress Notes (Signed)
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  He also had a resurfacing procedure done at [facility].  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  He was followed by [facility] bone and joint last seen January 2024 and told he needed to lose weight.  He is seeing a nutritionist he is already lost 30 pounds from his peak and is interested in losing more weight  He currently is not on any arthritis medication he complains of bilateral hip pain  Review of Systems  Respiratory:  Positive for shortness of breath.   Musculoskeletal:  Positive for back pain and joint pain.  Psychiatric/Behavioral:  Hallucinations: .physe.   All other systems reviewed and are negative.  Past Medical History:  Diagnosis Date   Arthritis    Complication of anesthesia    Pt had an epidural that went into his lungs   Hip disease    Hypertension    Seizures (HCC)    Past Surgical History:  Procedure Laterality Date   CYSTOSCOPY WITH RETROGRADE URETHROGRAM N/A 02/06/2016   Procedure: CYSTOSCOPY WITH RETROGRADE URETHROGRAM;  Surgeon: Bjorn Pippin, MD;  Location: [facility];  Service: Urology;  Laterality: N/A;   CYSTOSCOPY WITH URETHRAL DILATATION N/A 02/06/2016   Procedure: CYSTOSCOPY WITH URETHRAL DILATATION;  Surgeon: Bjorn Pippin, MD;  Location: [facility];   Service: Urology;  Laterality: N/A;   HIP SURGERY Left 1993 1995 1996   x 3   KNEE SURGERY Right 1999   Current Outpatient Medications  Medication Instructions   albuterol (VENTOLIN HFA) 108 (90 Base) MCG/ACT inhaler 2 puffs, Inhalation, Every 6 hours PRN   clotrimazole (LOTRIMIN) 1 % cream 1 Application, Topical, 2 times daily   lidocaine (LIDODERM) 5 % 1 patch, Transdermal, Every 24 hours, Remove & Discard patch within 12 hours or as directed by MD   meloxicam (MOBIC) 7.5 mg, Oral, Daily   olmesartan (BENICAR) 40 mg, Oral, Daily   Semaglutide-Weight Management 0.25 mg, Subcutaneous, Weekly   Vitamin D (Ergocalciferol) (DRISDOL) 50,000 Units, Oral, Every 7 days   Allergies  Allergen Reactions   Other Anaphylaxis    EPIDURAL ANESTHESIA     BP (!) 148/80   Ht 5' 9.5" (1.765 m)   Wt 292 lb (132.5 kg)   BMI 42.50 kg/m   Physical Exam Vitals and nursing note reviewed.  Constitutional:      Appearance: Normal appearance.  HENT:     Head: Normocephalic and atraumatic.  Eyes:     General: No scleral icterus.       Right eye: No discharge.        Left eye: No discharge.     Extraocular Movements: Extraocular movements intact.     Conjunctiva/sclera: Conjunctivae normal.     Pupils: Pupils are  equal, round, and reactive to light.  Cardiovascular:     Rate and Rhythm: Normal rate.     Pulses: Normal pulses.  Skin:    General: Skin is warm and dry.     Capillary Refill: Capillary refill takes less than 2 seconds.  Neurological:     General: No focal deficit present.     Mental Status: He is alert and oriented to person, place, and time.  Psychiatric:        Mood and Affect: Mood normal.        Behavior: Behavior normal.        Thought Content: Thought content normal.        Judgment: Judgment normal.    Orthopedic examination  The leg lengths are not equal.  He has a shorter left leg than right this appears to be coming from the femoral side  He walks with a limp  which is most likely from this leg length discrepancy which is approximately 1.2 cm  On the right side we find hip flexion past 90 degrees the hip goes into external rotation and hip flexion he has decreased internal rotation especially with the hip flexed.  Only mild discomfort at terminal hip flexion and not through the range of motion  On the left side his hip flexion is less than 90 degrees he has very little internal rotation less than 5 degrees and obligatory external rotation when the knee goes up into flexion.  However  Palpable tenderness is only in the back midline left side right side buttock.  Imaging  AP and lateral both hips  On the left side we see a resurfacing procedure which is in good position.  I believe there is a cup and this hip.  On the right side we see a pistol-grip deformity preserved joint space with peripheral osteophytes  Assessment and plan  Dx Encounter Diagnoses  Name Primary?   Hip pain, bilateral Yes   Body mass index 40.0-44.9, adult (HCC)    Legg-Calve-Perthes disease, bilateral    DDD (degenerative disc disease), lumbar    Arthritis of right hip    Arthritis of left hip    Status post total hip resurfacing left hip    At this point he is 42 years old he has a resurfaced left hip which is in good position his bilateral hip pain may be lumbar related.  His right hip is in good condition and should not need replacement especially in the setting of BMI over 39 and age of 60  I advised him to have his hip surgeries done at [facility] as they can further monitor the long-term follow-up on the resurfacing procedure and then proceed with right hip arthroplasty when needed.  If that is unsatisfactory for him and his right hip needs to be done I would think that Dr. Magnus Ivan would be fine doing the right hip but the takedown of the resurfacing procedure may be something that even he might not want to get involved with.  Will start him on some  meloxicam  His goal weight is 260 pounds  Meds ordered this encounter  Medications   meloxicam (MOBIC) 7.5 MG tablet    Sig: Take 1 tablet (7.5 mg total) by mouth daily.    Dispense:  30 tablet    Refill:  5   .

## 2022-09-23 NOTE — Patient Instructions (Signed)
Your goal weight for the surgery is 265 pounds You will need surgery again eventually on your left hip, when you are ready [facility] the [facility] to [facility] for that, you can call there to schedule  I will send your referral to [facility] orthopedics you can call them to schedule the number is [phone].

## 2022-10-13 ENCOUNTER — Telehealth: Payer: Self-pay | Admitting: Internal Medicine

## 2022-10-13 ENCOUNTER — Other Ambulatory Visit: Payer: Self-pay

## 2022-10-13 MED ORDER — OLMESARTAN MEDOXOMIL 40 MG PO TABS: 40.0000 mg | ORAL_TABLET | Freq: Every day | ORAL | 0 refills | Status: DC

## 2022-10-13 NOTE — Telephone Encounter (Signed)
Prescription Request  10/13/2022  LOV: 09/09/2022  What is the name of the medication or equipment? olmesartan (BENICAR) 40 MG tablet [[id number]]   Have you contacted your pharmacy to request a refill? No   Which pharmacy would you like this sent to?  [facility] [phone] - [place], [place] - [address] AT [facility] [address] [place] [address] Phone: [phone] Fax: [phone]    Patient notified that their request is being sent to the clinical staff for review and that they should receive a response within 2 business days.   Please advise at Mobile [phone] (mobile)

## 2022-10-13 NOTE — Telephone Encounter (Signed)
Refills sent to pharmacy. 

## 2022-10-26 ENCOUNTER — Encounter: Payer: Self-pay | Admitting: Pulmonary Disease

## 2022-10-26 ENCOUNTER — Ambulatory Visit (INDEPENDENT_AMBULATORY_CARE_PROVIDER_SITE_OTHER): Payer: Medicaid Other | Admitting: Pulmonary Disease

## 2022-10-26 VITALS — BP 134/94 | HR 87 | Ht 69.5 in | Wt 283.2 lb

## 2022-10-26 DIAGNOSIS — R0683 Snoring: Secondary | ICD-10-CM | POA: Diagnosis not present

## 2022-10-26 NOTE — Progress Notes (Signed)
[place] [facility], [facility] Sleep [facility]  Chief Complaint  Patient presents with   Consult    Past Surgical History:  He  has a past surgical history that includes Hip surgery (Left, 1993 1995 1996); Knee surgery (Right, 1999); Cystoscopy with urethral dilatation (N/A, 02/06/2016); and Cystoscopy with retrograde urethrogram (N/A, 02/06/2016).  Past Medical History:  Arthritis, HTN, Seizures  Constitutional:  BP (!) 134/94   Pulse 87   Ht 5' 9.5" (1.765 m)   Wt 283 lb 3.2 oz (128.5 kg)   SpO2 94%   BMI 41.22 kg/m   Brief Summary:  E[facility] is a 42 y.o. male with snoring.      Subjective:   His sister has been concerned about his sleep.  He snores and stops breathing while asleep.  He sometimes needs to nap for a couple hours after work.  He feels like he has no energy.  He gets leg cramps at night, but also gets cramps sometimes during the day.  He last had a seizure in the 1990's.  He goes to sleep at 11 pm.  He falls asleep in an hour.  He wakes up 1 or 2 times to use the bathroom.  He gets out of bed at 5 am.  He feels tired in the morning.  He denies morning headache.  He does not use anything to help him fall sleep or stay awake.  He denies sleep walking, sleep talking, bruxism, or nightmares.  There is no history of restless legs.  He denies sleep hallucinations, sleep paralysis, or cataplexy.  The Epworth score is 6 out of 24.   Physical Exam:   Appearance - well kempt   ENMT - no sinus tenderness, no oral exudate, no LAN, Mallampati 3 airway, no stridor  Respiratory - equal breath sounds bilaterally, no wheezing or rales  CV - s1s2 regular rate and rhythm, no murmurs  Ext - no clubbing, no edema  Skin - no rashes  Psych - normal mood and affect   Sleep Tests:    Social History:  He  reports that he has never smoked. He has never used smokeless tobacco. He reports that he does not drink alcohol and does not use drugs.  Family  History:  His family history includes Diabetes in his mother; Hypertension in his father and mother.    Discussion:  He has snoring, sleep disruption, apnea, and daytime sleepiness.  His BMI is > 35.  He has a history of hypertension.  I am concerned he could have obstructive sleep apnea.  Assessment/Plan:   Snoring with excessive daytime sleepiness. - will need to arrange for a home sleep study  Obesity. - discussed how weight can impact sleep and risk for sleep disordered breathing - discussed options to assist with weight loss: combination of diet modification, cardiovascular and strength training exercises  Cardiovascular risk. - had an extensive discussion regarding the adverse health consequences related to untreated sleep disordered breathing - specifically discussed the risks for hypertension, coronary artery disease, cardiac dysrhythmias, cerebrovascular disease, and diabetes - lifestyle modification discussed  Safe driving practices. - discussed how sleep disruption can increase risk of accidents, particularly when driving - safe driving practices were discussed  Therapies for obstructive sleep apnea. - if the sleep study shows significant sleep apnea, then various therapies for treatment were reviewed: CPAP, oral appliance, and surgical interventions  Time Spent Involved in Patient Care on Day of Examination:  36 minutes  Follow up:  Patient Instructions  Will arrange for a home sleep study Will call to arrange for follow up after sleep study reviewed   Medication List:   Allergies as of 10/26/2022       Reactions   Other Anaphylaxis   EPIDURAL ANESTHESIA        Medication List        Accurate as of October 26, 2022  3:58 PM. If you have any questions, ask your nurse or doctor.          albuterol 108 (90 Base) MCG/ACT inhaler Commonly known as: VENTOLIN HFA Inhale 2 puffs into the lungs every 6 (six) hours as needed for wheezing or shortness of  breath.   clotrimazole 1 % cream Commonly known as: LOTRIMIN Apply 1 Application topically 2 (two) times daily.   lidocaine 5 % Commonly known as: LIDODERM Place 1 patch onto the skin daily. Remove & Discard patch within 12 hours or as directed by MD   meloxicam 7.5 MG tablet Commonly known as: Mobic Take 1 tablet (7.5 mg total) by mouth daily.   olmesartan 40 MG tablet Commonly known as: BENICAR Take 1 tablet (40 mg total) by mouth daily.   Vitamin D (Ergocalciferol) 1.25 MG (50000 UNIT) Caps capsule Commonly known as: DRISDOL Take 1 capsule (50,000 Units total) by mouth every 7 (seven) days for 12 doses.        Signature:  Coralyn Helling, MD [facility] Pulmonary/Critical Care Pager - [phone] 10/26/2022, 3:58 PM

## 2022-10-26 NOTE — Patient Instructions (Signed)
Will arrange for a home sleep study Will call to arrange for follow up after sleep study reviewed  

## 2022-11-08 DIAGNOSIS — G4733 Obstructive sleep apnea (adult) (pediatric): Secondary | ICD-10-CM

## 2022-11-08 DIAGNOSIS — R0683 Snoring: Secondary | ICD-10-CM

## 2022-11-10 ENCOUNTER — Telehealth: Payer: Self-pay | Admitting: Pulmonary Disease

## 2022-11-10 DIAGNOSIS — G4733 Obstructive sleep apnea (adult) (pediatric): Secondary | ICD-10-CM

## 2022-11-10 NOTE — Telephone Encounter (Signed)
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  Please arrange for ROV with me or NP to discuss treatment options.

## 2022-11-12 NOTE — Telephone Encounter (Signed)
Okay to double book visit.  Can also schedule as a video visit if he is okay with that.

## 2022-11-12 NOTE — Telephone Encounter (Signed)
I called and spoke with the pt  He is aware of results  Dr Craige Cotta- can we overbook you to see this one? You nor the [facility] have appts open soon  Thanks!

## 2022-11-12 NOTE — Telephone Encounter (Signed)
Spoke with the pt and notified of response per Dr Craige Cotta  Appt for 11/29/22 at 1:30 pm

## 2022-11-22 ENCOUNTER — Ambulatory Visit: Payer: Medicaid Other | Admitting: Nutrition

## 2022-11-29 ENCOUNTER — Ambulatory Visit: Payer: Medicaid Other | Admitting: Pulmonary Disease

## 2022-11-29 ENCOUNTER — Encounter: Payer: Self-pay | Admitting: Pulmonary Disease

## 2022-11-29 VITALS — BP 108/67 | HR 65 | Ht 69.5 in | Wt 283.0 lb

## 2022-11-29 DIAGNOSIS — G4733 Obstructive sleep apnea (adult) (pediatric): Secondary | ICD-10-CM | POA: Diagnosis not present

## 2022-11-29 DIAGNOSIS — Z7189 Other specified counseling: Secondary | ICD-10-CM | POA: Diagnosis not present

## 2022-11-29 NOTE — Patient Instructions (Signed)
Will arrange for new CPAP set up  Follow up in 4 months 

## 2022-11-29 NOTE — Progress Notes (Signed)
[place] [facility], [facility], and Sleep Medicine  Chief Complaint  Patient presents with   Sleep Apnea    Past Surgical History:  He  has a past surgical history that includes Hip surgery (Left, 1993 1995 1996); Knee surgery (Right, 1999); Cystoscopy with urethral dilatation (N/A, 02/06/2016); and Cystoscopy with retrograde urethrogram (N/A, 02/06/2016).  Past Medical History:  Arthritis, HTN, Seizures  Constitutional:  BP 108/67   Pulse 65   Ht 5' 9.5" (1.765 m)   Wt 283 lb (128.4 kg)   SpO2 96%   BMI 41.19 kg/m   Brief Summary:  [facility] is a 42 y.o. male with obstructive sleep apnea.      Subjective:   Home sleep study showed severe sleep apnea.  He has a sore on his medial left leg.  Has varicose veins.  Has appointment with vascular surgery scheduled.   Physical Exam:   Appearance - well kempt   ENMT - no sinus tenderness, no oral exudate, no LAN, Mallampati 3 airway, no stridor  Respiratory - equal breath sounds bilaterally, no wheezing or rales  CV - s1s2 regular rate and rhythm, no murmurs  Ext - no clubbing, no edema  Skin - no rashes  Psych - normal mood and affect   Sleep Tests:  HST 11/08/22 >> AHI 50.8, SpO2 low 83%   Social History:  He  reports that he has never smoked. He has never used smokeless tobacco. He reports that he does not drink alcohol and does not use drugs.  Family History:  His family history includes Diabetes in his mother; Hypertension in his father and mother.     Assessment/Plan:   Obstructive sleep apnea. - sleep study reviewed - discussed how sleep apnea can impact his health - treatment options reviewed - will arrange for auto CPAP set up 5 to 15 cm H2O  Obesity. - discussed how weight can impact sleep and risk for sleep disordered breathing - discussed options to assist with weight loss: combination of diet modification, cardiovascular and strength training exercises  Varicose veins. - he has  appointment with vascular surgery  Time Spent Involved in Patient Care on Day of Examination:  27 minutes  Follow up:   Patient Instructions  Will arrange for new CPAP set up  Follow up in 4 months  Medication List:   Allergies as of 11/29/2022       Reactions   Other Anaphylaxis   EPIDURAL ANESTHESIA        Medication List        Accurate as of November 29, 2022  2:17 PM. If you have any questions, ask your nurse or doctor.          albuterol 108 (90 Base) MCG/ACT inhaler Commonly known as: VENTOLIN HFA Inhale 2 puffs into the lungs every 6 (six) hours as needed for wheezing or shortness of breath.   clotrimazole 1 % cream Commonly known as: LOTRIMIN Apply 1 Application topically 2 (two) times daily.   lidocaine 5 % Commonly known as: LIDODERM Place 1 patch onto the skin daily. Remove & Discard patch within 12 hours or as directed by MD   meloxicam 7.5 MG tablet Commonly known as: Mobic Take 1 tablet (7.5 mg total) by mouth daily.   olmesartan 40 MG tablet Commonly known as: BENICAR Take 1 tablet (40 mg total) by mouth daily.        Signature:  Coralyn Helling, MD [facility] Pulmonary/Critical Care Pager - [phone]  11/29/2022, 2:17 PM

## 2022-12-10 ENCOUNTER — Ambulatory Visit: Payer: Medicaid Other | Admitting: Internal Medicine

## 2022-12-13 ENCOUNTER — Telehealth: Payer: Self-pay | Admitting: Pulmonary Disease

## 2022-12-13 ENCOUNTER — Ambulatory Visit: Payer: Medicaid Other | Admitting: Internal Medicine

## 2022-12-13 ENCOUNTER — Encounter: Payer: Self-pay | Admitting: Internal Medicine

## 2022-12-13 VITALS — BP 128/82 | HR 61 | Ht 69.5 in | Wt 279.2 lb

## 2022-12-13 DIAGNOSIS — E559 Vitamin D deficiency, unspecified: Secondary | ICD-10-CM | POA: Diagnosis not present

## 2022-12-13 DIAGNOSIS — R2242 Localized swelling, mass and lump, left lower limb: Secondary | ICD-10-CM | POA: Diagnosis not present

## 2022-12-13 DIAGNOSIS — M160 Bilateral primary osteoarthritis of hip: Secondary | ICD-10-CM

## 2022-12-13 DIAGNOSIS — Z2821 Immunization not carried out because of patient refusal: Secondary | ICD-10-CM | POA: Diagnosis not present

## 2022-12-13 DIAGNOSIS — G4733 Obstructive sleep apnea (adult) (pediatric): Secondary | ICD-10-CM

## 2022-12-13 MED ORDER — METHOCARBAMOL 500 MG PO TABS: 500.0000 mg | ORAL_TABLET | Freq: Four times a day (QID) | ORAL | 2 refills | Status: DC | PRN

## 2022-12-13 NOTE — Assessment & Plan Note (Signed)
Noted on previous labs.  He has completed high-dose, weekly vitamin D supplementation. -Repeat vitamin D level ordered today 

## 2022-12-13 NOTE — Assessment & Plan Note (Signed)
Robaxin prescribed for as needed pain relief at patient's request.

## 2022-12-13 NOTE — Assessment & Plan Note (Signed)
BMI 40.6.  He has lost 14 pounds since his last appointment in May and remains focused on lifestyle modifications aimed at weight loss.  He remains interested in starting [place] now that insurance coverage has changed. -Start Wegovy 0.25 mg weekly -Follow-up in 3 months for reassessment

## 2022-12-13 NOTE — Assessment & Plan Note (Signed)
Painful, soft tissue mass present on the medial aspect of the left thigh.  It has been intermittently present since age 42.  He would like to have this further investigated. -Soft tissue ultrasound ordered today

## 2022-12-13 NOTE — Telephone Encounter (Signed)
Patient states has not heard from from DME company regarding CPAP setup LOV with [place] 11/29/22. Please advise.

## 2022-12-13 NOTE — Assessment & Plan Note (Signed)
Followed by pulmonology.  He is waiting to hear from his DME company about CPAP set up.

## 2022-12-13 NOTE — Progress Notes (Signed)
Established Patient Office Visit  Subjective   Patient ID: Eddie Marshall, male    DOB: 1981/02/01  Age: 42 y.o. MRN: [id number]  Chief Complaint  Patient presents with   Obesity    Follow up    Eddie Marshall returns to care today for routine follow-up.  He was last evaluated by me on 5/23.  Eddie Marshall was prescribed for weight loss in the setting of morbid obesity at that time.  41-month follow-up was arranged.  In the interim he has been evaluated by orthopedic surgery and pulmonology.  There have otherwise been no acute interval events.  Today Eddie Marshall reports feeling fairly well.  His acute concern is a painful mass on the medial aspect of his left thigh.  He states that the mass has been intermittently present for many years, first noticed at age 45.  Most recently and has been present for the last 3 weeks and causes discomfort when walking.  He would like to have this further evaluated.  He has not been able to start Eddie Marshall due to lack of insurance coverage.  He remains focused on lifestyle modifications aimed at weight loss however and has lost 14 pounds since his last appointment.  Past Medical History:  Diagnosis Date   Arthritis    Complication of anesthesia    Pt had an epidural that went into his lungs   Hip disease    Hypertension    Seizures (HCC)    Past Surgical History:  Procedure Laterality Date   CYSTOSCOPY WITH RETROGRADE URETHROGRAM N/A 02/06/2016   Procedure: CYSTOSCOPY WITH RETROGRADE URETHROGRAM;  Surgeon: Eddie Pippin, MD;  Location: [facility];  Service: Urology;  Laterality: N/A;   CYSTOSCOPY WITH URETHRAL DILATATION N/A 02/06/2016   Procedure: CYSTOSCOPY WITH URETHRAL DILATATION;  Surgeon: Eddie Pippin, MD;  Location: [facility];  Service: Urology;  Laterality: N/A;   HIP SURGERY Left 1993 1995 1996   x 3   KNEE SURGERY Right 1999   Social History   Tobacco Use   Smoking status: Never   Smokeless tobacco: Never  Substance Use Topics   Alcohol use: No   Drug use: No    Family History  Problem Relation Age of Onset   Diabetes Mother    Hypertension Mother    Hypertension Father    Allergies  Allergen Reactions   Other Anaphylaxis    EPIDURAL ANESTHESIA     Review of Systems  Musculoskeletal:  Positive for back pain.       Painful soft tissue mass on medial left thigh  All other systems reviewed and are negative.    Objective:     BP 128/82   Pulse 61   Ht 5' 9.5" (1.765 m)   Wt 279 lb 3.2 oz (126.6 kg)   SpO2 95%   BMI 40.64 kg/m  BP Readings from Last 3 Encounters:  12/13/22 128/82  11/29/22 108/67  10/26/22 (!) 134/94   Physical Exam Vitals reviewed.  Constitutional:      General: He is not in acute distress.    Appearance: Normal appearance. He is obese. He is not ill-appearing.  HENT:     Head: Normocephalic and atraumatic.     Right Ear: External ear normal.     Left Ear: External ear normal.     Nose: Nose normal. No congestion or rhinorrhea.     Mouth/Throat:     Mouth: Mucous membranes are moist.     Pharynx: Oropharynx is clear.  Eyes:  General: No scleral icterus.    Extraocular Movements: Extraocular movements intact.     Conjunctiva/sclera: Conjunctivae normal.     Pupils: Pupils are equal, round, and reactive to light.  Cardiovascular:     Rate and Rhythm: Normal rate and regular rhythm.     Pulses: Normal pulses.     Heart sounds: Normal heart sounds. No murmur heard. Pulmonary:     Effort: Pulmonary effort is normal.     Breath sounds: Normal breath sounds. No wheezing, rhonchi or rales.  Abdominal:     General: Abdomen is flat. Bowel sounds are normal. There is no distension.     Palpations: Abdomen is soft.     Tenderness: There is no abdominal tenderness.  Musculoskeletal:        General: No swelling or deformity. Normal range of motion.     Cervical back: Normal range of motion.  Skin:    General: Skin is warm and dry.     Capillary Refill: Capillary refill takes less than 2 seconds.      Findings: Lesion (Mobile, tender, soft tissue mass present on the medial aspect of the left thigh above the knee.  There is no purulence or surrounding erythema appreciated.) present.  Neurological:     General: No focal deficit present.     Mental Status: He is alert and oriented to person, place, and time.     Motor: No weakness.  Psychiatric:        Mood and Affect: Mood normal.        Behavior: Behavior normal.        Thought Content: Thought content normal.   Last CBC Lab Results  Component Value Date   WBC 8.3 08/16/2022   HGB 16.2 08/16/2022   HCT 48.8 08/16/2022   MCV 88 08/16/2022   MCH 29.3 08/16/2022   RDW 12.9 08/16/2022   PLT 261 08/16/2022   Last metabolic panel Lab Results  Component Value Date   GLUCOSE 88 09/16/2022   NA 142 09/16/2022   K 4.1 09/16/2022   CL 104 09/16/2022   CO2 25 09/16/2022   BUN 14 09/16/2022   CREATININE 0.77 09/16/2022   EGFR 113 09/16/2022   CALCIUM 9.3 09/16/2022   PROT 7.2 09/16/2022   ALBUMIN 4.4 09/16/2022   LABGLOB 2.8 09/16/2022   AGRATIO 1.6 09/16/2022   BILITOT 1.4 (H) 09/16/2022   ALKPHOS 57 09/16/2022   AST 21 09/16/2022   ALT 23 09/16/2022   ANIONGAP 5 01/21/2016   Last lipids Lab Results  Component Value Date   CHOL 123 08/16/2022   HDL 38 (L) 08/16/2022   LDLCALC 72 08/16/2022   TRIG 59 08/16/2022   CHOLHDL 3.2 08/16/2022   Last hemoglobin A1c Lab Results  Component Value Date   HGBA1C 5.6 08/16/2022   Last thyroid functions Lab Results  Component Value Date   TSH 2.430 08/16/2022   Last vitamin D Lab Results  Component Value Date   VD25OH 12.2 (L) 08/16/2022   Last vitamin B12 and Folate Lab Results  Component Value Date   VITAMINB12 322 08/16/2022   FOLATE 8.3 08/16/2022     Assessment & Plan:   Problem List Items Addressed This Visit       Severe obstructive sleep apnea    Followed by pulmonology.  He is waiting to hear from his DME company about CPAP set up.      Osteoarthritis  of hips, bilateral    Robaxin prescribed for as needed pain  relief at patient's request.      Morbid obesity (HCC)    BMI 40.6.  He has lost 14 pounds since his last appointment in May and remains focused on lifestyle modifications aimed at weight loss.  He remains interested in starting [place] now that insurance coverage has changed. -Start Wegovy 0.25 mg weekly -Follow-up in 3 months for reassessment      Vitamin D deficiency    Noted on previous labs.  He has completed high-dose, weekly vitamin D supplementation.  Repeat vitamin D level ordered today.      Leg mass, left    Painful, soft tissue mass present on the medial aspect of the left thigh.  It has been intermittently present since age 74.  He would like to have this further investigated. -Soft tissue ultrasound ordered today      Return in about 3 months (around 03/15/2023).   Billie Lade, MD

## 2022-12-13 NOTE — Patient Instructions (Signed)
It was a pleasure to see you today.  Thank you for giving [place] the opportunity to be involved in your care.  Below is a brief recap of your visit and next steps.  We will plan to see you again in 3 months.  Summary Start [phone] for weight loss Left leg ultrasound ordered Repeat vitamin D level Follow up in 3 months

## 2022-12-14 ENCOUNTER — Other Ambulatory Visit: Payer: Self-pay

## 2022-12-14 LAB — VITAMIN D 25 HYDROXY (VIT D DEFICIENCY, FRACTURES): Vit D, 25-Hydroxy: 31.3 ng/mL (ref 30.0–100.0)

## 2022-12-14 MED ORDER — WEGOVY 0.25 MG/0.5ML ~~LOC~~ SOAJ: 0.2500 mg | SUBCUTANEOUS | 0 refills | Status: DC

## 2022-12-14 NOTE — Addendum Note (Signed)
Addended by: Telford Nab on: 12/14/2022 04:29 PM   Modules accepted: Orders

## 2022-12-14 NOTE — Telephone Encounter (Signed)
Called [facility] and they have all that they need and just need a CMN signed gave them the fax for [facility] Dr Craige Cotta is back on Friday.  Patient aware also

## 2022-12-21 ENCOUNTER — Ambulatory Visit (HOSPITAL_COMMUNITY)
Admission: RE | Admit: 2022-12-21 | Discharge: 2022-12-21 | Disposition: A | Discharge status: Home / Self Care | Payer: Medicaid Other | Source: Ambulatory Visit | Attending: Internal Medicine | Admitting: Internal Medicine

## 2022-12-21 DIAGNOSIS — R2242 Localized swelling, mass and lump, left lower limb: Secondary | ICD-10-CM | POA: Insufficient documentation

## 2022-12-29 ENCOUNTER — Other Ambulatory Visit: Payer: Self-pay | Admitting: Internal Medicine

## 2022-12-29 ENCOUNTER — Telehealth: Payer: Self-pay | Admitting: Internal Medicine

## 2022-12-29 DIAGNOSIS — I82890 Acute embolism and thrombosis of other specified veins: Secondary | ICD-10-CM | POA: Insufficient documentation

## 2022-12-29 MED ORDER — RIVAROXABAN 10 MG PO TABS: 10.0000 mg | ORAL_TABLET | Freq: Every day | ORAL | 0 refills | Status: DC

## 2022-12-29 NOTE — Telephone Encounter (Signed)
Patient called has questions about his ultrasound call back # [phone].

## 2022-12-30 NOTE — Telephone Encounter (Signed)
Spoke with patient and made an appt.

## 2023-01-04 ENCOUNTER — Ambulatory Visit: Payer: Medicaid Other | Admitting: Internal Medicine

## 2023-01-04 ENCOUNTER — Encounter: Payer: Self-pay | Admitting: Internal Medicine

## 2023-01-04 VITALS — BP 134/83 | HR 60 | Ht 69.5 in | Wt 281.6 lb

## 2023-01-04 DIAGNOSIS — I82890 Acute embolism and thrombosis of other specified veins: Secondary | ICD-10-CM | POA: Diagnosis not present

## 2023-01-04 NOTE — Patient Instructions (Signed)
It was a pleasure to see you today.  Thank you for giving [place] the opportunity to be involved in your care.  Below is a brief recap of your visit and next steps.  We will plan to see you again in November.  Summary Xarelto x 45 days for treatment of blood clot in your leg Follow up as scheduled in November.

## 2023-01-04 NOTE — Assessment & Plan Note (Signed)
Superficial vein thrombosis noted on recent ultrasound of the left lower extremity.  He has prophylactically been prescribed Xarelto 10 mg daily x 45 days.  Repeat imaging is recommended in 3-6 months per radiology.  He will return to care for reassessment on 11/26 and will consider repeat imaging at that time.

## 2023-01-04 NOTE — Progress Notes (Signed)
Acute Office Visit  Subjective:     Patient ID: Eddie Marshall, male    DOB: [birth year], 42 y.o.   MRN: [id number]  Chief Complaint  Patient presents with   Follow-up    [place] upper left leg.    Hip Pain    Left hip pain.    Eddie Marshall returns to care today for an acute visit to discuss recent ultrasound results.  Eddie Marshall was last evaluated by me on 8/26 at which time Eddie Marshall reported an intermittently present, painful soft tissue mass along the medial aspect of the left thigh.  This was first noticed at age 45.  A soft tissue ultrasound was ordered and subsequently demonstrated findings most likely representing a superficial thrombosis in a varicose vein.  This result was reviewed by my partner, Dr. Allena Katz, while I was out of office.  Eddie Marshall was subsequently prescribed Xarelto 10 mg daily x 45 days.  Eddie Marshall does not have any additional concerns to discuss today.  Eddie Marshall endorses chronic bilateral hip pain.  Review of Systems  Musculoskeletal:  Positive for joint pain (Chronic bilateral hip pain).      Objective:    BP 134/83 (BP Location: Left Arm, Patient Position: Sitting, Cuff Size: Normal)   Pulse 60   Ht 5' 9.5" (1.765 m)   Wt 281 lb 9.6 oz (127.7 kg)   SpO2 96%   BMI 40.99 kg/m  BP Readings from Last 3 Encounters:  01/04/23 134/83  12/13/22 128/82  11/29/22 108/67   Physical Exam Vitals reviewed.  Constitutional:      General: Eddie Marshall is not in acute distress.    Appearance: Normal appearance. Eddie Marshall is obese. Eddie Marshall is not ill-appearing.  HENT:     Head: Normocephalic and atraumatic.     Right Ear: External ear normal.     Left Ear: External ear normal.     Nose: Nose normal. No congestion or rhinorrhea.     Mouth/Throat:     Mouth: Mucous membranes are moist.     Pharynx: Oropharynx is clear.  Eyes:     General: No scleral icterus.    Extraocular Movements: Extraocular movements intact.     Conjunctiva/sclera: Conjunctivae normal.     Pupils: Pupils are equal, round, and reactive  to light.  Cardiovascular:     Rate and Rhythm: Normal rate and regular rhythm.     Pulses: Normal pulses.     Heart sounds: Normal heart sounds. No murmur heard. Pulmonary:     Effort: Pulmonary effort is normal.     Breath sounds: Normal breath sounds. No wheezing, rhonchi or rales.  Abdominal:     General: Abdomen is flat. Bowel sounds are normal. There is no distension.     Palpations: Abdomen is soft.     Tenderness: There is no abdominal tenderness.  Musculoskeletal:        General: No swelling or deformity. Normal range of motion.     Cervical back: Normal range of motion.  Skin:    General: Skin is warm and dry.     Capillary Refill: Capillary refill takes less than 2 seconds.     Findings: Lesion (Palpable, nontender mass, present on the medial aspect of the left thigh) present.  Neurological:     General: No focal deficit present.     Mental Status: Eddie Marshall is alert and oriented to person, place, and time.     Motor: No weakness.  Psychiatric:        Mood  and Affect: Mood normal.        Behavior: Behavior normal.        Thought Content: Thought content normal.       Assessment & Plan:   Problem List Items Addressed This Visit       Superficial vein thrombosis - Primary    Superficial vein thrombosis noted on recent ultrasound of the left lower extremity.  Eddie Marshall has prophylactically been prescribed Xarelto 10 mg daily x 45 days.  Repeat imaging is recommended in 3-6 months per radiology.  Eddie Marshall will return to care for reassessment on 11/26 and will consider repeat imaging at that time.      Return if symptoms worsen or fail to improve.  Billie Lade, MD

## 2023-01-26 ENCOUNTER — Other Ambulatory Visit: Payer: Self-pay

## 2023-01-26 ENCOUNTER — Telehealth: Payer: Self-pay | Admitting: Internal Medicine

## 2023-01-26 DIAGNOSIS — R053 Chronic cough: Secondary | ICD-10-CM

## 2023-01-26 MED ORDER — ALBUTEROL SULFATE HFA 108 (90 BASE) MCG/ACT IN AERS
2.0000 | INHALATION_SPRAY | Freq: Four times a day (QID) | RESPIRATORY_TRACT | 2 refills | Status: AC | PRN
  Filled 2023-12-16: qty 18, 25d supply, fill #0

## 2023-01-26 NOTE — Telephone Encounter (Signed)
Patient needing a refill on albuterol (VENTOLIN HFA) 108 (90 Base) MCG/ACT inhaler [[id number]] Please advise [facility] on [address] Thank you

## 2023-01-26 NOTE — Telephone Encounter (Signed)
Refills sent to pharmacy. 

## 2023-02-09 ENCOUNTER — Other Ambulatory Visit: Payer: Self-pay | Admitting: Internal Medicine

## 2023-02-09 MED ORDER — OLMESARTAN MEDOXOMIL 40 MG PO TABS: 40.0000 mg | ORAL_TABLET | Freq: Every day | ORAL | 0 refills | Status: DC

## 2023-02-15 ENCOUNTER — Telehealth: Payer: Self-pay | Admitting: Internal Medicine

## 2023-02-15 NOTE — Telephone Encounter (Signed)
Aware that provider will review at his next appt if and when the imaging is needed and to make sure he finishes the 45 days of blood thinner

## 2023-02-15 NOTE — Telephone Encounter (Signed)
Patient called on last 3 pills prescribed for blood clots. Wants to know if provider still wants to have  ultra sound .  Wants a cll back

## 2023-03-06 ENCOUNTER — Other Ambulatory Visit: Payer: Self-pay | Admitting: Internal Medicine

## 2023-03-07 MED ORDER — SEMAGLUTIDE-WEIGHT MANAGEMENT 0.5 MG/0.5ML ~~LOC~~ SOAJ: 0.5000 mg | SUBCUTANEOUS | 0 refills | Status: DC

## 2023-03-10 ENCOUNTER — Telehealth: Payer: Self-pay

## 2023-03-10 NOTE — Telephone Encounter (Signed)
Will resend records

## 2023-03-10 NOTE — Telephone Encounter (Signed)
Copied from CRM [id number]. Topic: Medical Record Request - Other >> Mar 10, 2023 12:24 PM Dennison Nancy wrote: Reason for CRM: Cathlean Cower from [employer] called recieved partial fax for patient medical records,the cover sheet showing 42 pages ,[employer] only recieved 18 pages. Provided [employer] with the medical records number

## 2023-03-15 ENCOUNTER — Encounter: Payer: Self-pay | Admitting: Internal Medicine

## 2023-03-15 ENCOUNTER — Other Ambulatory Visit (HOSPITAL_COMMUNITY): Payer: Self-pay

## 2023-03-15 ENCOUNTER — Ambulatory Visit: Payer: Medicaid Other | Admitting: Internal Medicine

## 2023-03-15 VITALS — BP 117/77 | HR 60 | Ht 67.5 in | Wt 283.6 lb

## 2023-03-15 DIAGNOSIS — G4733 Obstructive sleep apnea (adult) (pediatric): Secondary | ICD-10-CM

## 2023-03-15 DIAGNOSIS — I82890 Acute embolism and thrombosis of other specified veins: Secondary | ICD-10-CM | POA: Diagnosis not present

## 2023-03-15 DIAGNOSIS — I10 Essential (primary) hypertension: Secondary | ICD-10-CM | POA: Diagnosis not present

## 2023-03-15 MED ORDER — SEMAGLUTIDE-WEIGHT MANAGEMENT 1 MG/0.5ML ~~LOC~~ SOAJ
1.0000 mg | SUBCUTANEOUS | 0 refills | Status: DC
  Filled 2023-03-15: qty 2, 28d supply, fill #0

## 2023-03-15 NOTE — Assessment & Plan Note (Signed)
His weight today is 283 pounds.  He recently started [facility] and is currently taking 0.5 mg weekly.  He completed his fourth injection earlier this week. -Increase Wegovy to 1 mg weekly.  Lifestyle modifications aimed at weight loss were reinforced today

## 2023-03-15 NOTE — Patient Instructions (Signed)
It was a pleasure to see you today.  Thank you for giving [place] the opportunity to be involved in your care.  Below is a brief recap of your visit and next steps.  We will plan to see you again in 3 months.  Summary Increase Wegovy to 1 mg weekly. We will plan to further increase after you complete 4 injections. Vascular surgery referral placed today Follow up in 3 months

## 2023-03-15 NOTE — Progress Notes (Signed)
Established Patient Office Visit  Subjective   Patient ID: Eddie Marshall, male    DOB: [birth year]  Age: 42 y.o. MRN: [id number]  Chief Complaint  Patient presents with   Hypertension    Follow up    Cyst    Knot on left leg, painful    Eddie Marshall returns to care today for routine follow-up.  He was last evaluated by me on 9/17 to discuss recent ultrasound results.  A superficial vein thrombus was noted on the medial aspect of his left thigh.  He was prescribed Xarelto 10 mg daily x 45 days with repeat imaging recommended in 3-6 months.  There have been no acute interval events.  Previously seen for routine follow-up on 8/26. Eddie Marshall reports feeling fairly well today.  His acute concern is that the painful mass on the medial aspect of his left thigh has returned since completing Xarelto.  Past Medical History:  Diagnosis Date   Arthritis    Complication of anesthesia    Pt had an epidural that went into his lungs   Hip disease    Hypertension    Seizures (HCC)    Past Surgical History:  Procedure Laterality Date   CYSTOSCOPY WITH RETROGRADE URETHROGRAM N/A 02/06/2016   Procedure: CYSTOSCOPY WITH RETROGRADE URETHROGRAM;  Surgeon: Bjorn Pippin, MD;  Location: [facility];  Service: Urology;  Laterality: N/A;   CYSTOSCOPY WITH URETHRAL DILATATION N/A 02/06/2016   Procedure: CYSTOSCOPY WITH URETHRAL DILATATION;  Surgeon: Bjorn Pippin, MD;  Location: [facility];  Service: Urology;  Laterality: N/A;   HIP SURGERY Left 1993 1995 1996   x 3   KNEE SURGERY Right 1999   Social History   Tobacco Use   Smoking status: Never   Smokeless tobacco: Never  Substance Use Topics   Alcohol use: No   Drug use: No   Family History  Problem Relation Age of Onset   Diabetes Mother    Hypertension Mother    Hypertension Father    Allergies  Allergen Reactions   Other Anaphylaxis    EPIDURAL ANESTHESIA     Review of Systems  Musculoskeletal:        Painful mass on medial aspect of left thigh  All  other systems reviewed and are negative.     Objective:     BP 117/77 (BP Location: Right Arm, Patient Position: Sitting, Cuff Size: Large)   Pulse 60   Ht 5' 7.5" (1.715 m)   Wt 283 lb 9.6 oz (128.6 kg)   SpO2 96%   BMI 43.76 kg/m  BP Readings from Last 3 Encounters:  03/15/23 117/77  01/04/23 134/83  12/13/22 128/82   Physical Exam Vitals reviewed.  Constitutional:      General: He is not in acute distress.    Appearance: Normal appearance. He is obese. He is not ill-appearing.  HENT:     Head: Normocephalic and atraumatic.     Right Ear: External ear normal.     Left Ear: External ear normal.     Nose: Nose normal. No congestion or rhinorrhea.     Mouth/Throat:     Mouth: Mucous membranes are moist.     Pharynx: Oropharynx is clear.  Eyes:     General: No scleral icterus.    Extraocular Movements: Extraocular movements intact.     Conjunctiva/sclera: Conjunctivae normal.     Pupils: Pupils are equal, round, and reactive to light.  Cardiovascular:     Rate and Rhythm: Normal rate and  regular rhythm.     Pulses: Normal pulses.     Heart sounds: Normal heart sounds. No murmur heard. Pulmonary:     Effort: Pulmonary effort is normal.     Breath sounds: Normal breath sounds. No wheezing, rhonchi or rales.  Abdominal:     General: Abdomen is flat. Bowel sounds are normal. There is no distension.     Palpations: Abdomen is soft.     Tenderness: There is no abdominal tenderness.  Musculoskeletal:        General: Deformity (Palpable, tender, mobile mass present along the medial aspect of the left thigh above the knee.  No significant erythema or induration is present) present. No swelling. Normal range of motion.     Cervical back: Normal range of motion.  Skin:    General: Skin is warm and dry.     Capillary Refill: Capillary refill takes less than 2 seconds.  Neurological:     General: No focal deficit present.     Mental Status: He is alert and oriented to person,  place, and time.     Motor: No weakness.  Psychiatric:        Mood and Affect: Mood normal.        Behavior: Behavior normal.        Thought Content: Thought content normal.   Last CBC Lab Results  Component Value Date   WBC 8.3 08/16/2022   HGB 16.2 08/16/2022   HCT 48.8 08/16/2022   MCV 88 08/16/2022   MCH 29.3 08/16/2022   RDW 12.9 08/16/2022   PLT 261 08/16/2022   Last metabolic panel Lab Results  Component Value Date   GLUCOSE 88 09/16/2022   NA 142 09/16/2022   K 4.1 09/16/2022   CL 104 09/16/2022   CO2 25 09/16/2022   BUN 14 09/16/2022   CREATININE 0.77 09/16/2022   EGFR 113 09/16/2022   CALCIUM 9.3 09/16/2022   PROT 7.2 09/16/2022   ALBUMIN 4.4 09/16/2022   LABGLOB 2.8 09/16/2022   AGRATIO 1.6 09/16/2022   BILITOT 1.4 (H) 09/16/2022   ALKPHOS 57 09/16/2022   AST 21 09/16/2022   ALT 23 09/16/2022   ANIONGAP 5 01/21/2016   Last lipids Lab Results  Component Value Date   CHOL 123 08/16/2022   HDL 38 (L) 08/16/2022   LDLCALC 72 08/16/2022   TRIG 59 08/16/2022   CHOLHDL 3.2 08/16/2022   Last hemoglobin A1c Lab Results  Component Value Date   HGBA1C 5.6 08/16/2022   Last thyroid functions Lab Results  Component Value Date   TSH 2.430 08/16/2022   Last vitamin D Lab Results  Component Value Date   VD25OH 31.3 12/13/2022   Last vitamin B12 and Folate Lab Results  Component Value Date   VITAMINB12 322 08/16/2022   FOLATE 8.3 08/16/2022     Assessment & Plan:   Problem List Items Addressed This Visit       Essential hypertension    Remains adequately controlled on current antihypertensive regimen.  No medication changes are indicated today.      Superficial vein thrombosis - Primary    Noted on ultrasound of left lower extremity obtained in September.  Treated prophylactically with Xarelto 10 mg daily x 45 days.  Today he reports that the mass resolved while on anticoagulation, but returned within 4 days of completing treatment. -Given  recurrence of superficial vein thrombosis despite appropriate anticoagulation, vascular surgery referral placed today for further evaluation.      Morbid obesity (  HCC)    His weight today is 283 pounds.  He recently started [facility] and is currently taking 0.5 mg weekly.  He completed his fourth injection earlier this week. -Increase Wegovy to 1 mg weekly.  Lifestyle modifications aimed at weight loss were reinforced today       Return in about 3 months (around 06/15/2023).    Billie Lade, MD

## 2023-03-15 NOTE — Assessment & Plan Note (Signed)
Remains adequately controlled on current antihypertensive regimen.  No medication changes are indicated today.

## 2023-03-15 NOTE — Assessment & Plan Note (Signed)
Noted on ultrasound of left lower extremity obtained in September.  Treated prophylactically with Xarelto 10 mg daily x 45 days.  Today he reports that the mass resolved while on anticoagulation, but returned within 4 days of completing treatment. -Given recurrence of superficial vein thrombosis despite appropriate anticoagulation, vascular surgery referral placed today for further evaluation.

## 2023-03-18 ENCOUNTER — Other Ambulatory Visit (HOSPITAL_COMMUNITY): Payer: Self-pay

## 2023-04-01 ENCOUNTER — Telehealth: Payer: Self-pay

## 2023-04-01 DIAGNOSIS — G4733 Obstructive sleep apnea (adult) (pediatric): Secondary | ICD-10-CM

## 2023-04-01 MED ORDER — SEMAGLUTIDE-WEIGHT MANAGEMENT 1.7 MG/0.75ML ~~LOC~~ SOAJ
1.7000 mg | SUBCUTANEOUS | 0 refills | Status: DC
  Filled 2023-04-01 – 2023-04-06 (×2): qty 3, 28d supply, fill #0

## 2023-04-01 NOTE — Telephone Encounter (Signed)
Copied from CRM [id number]. Topic: Clinical - Medication Question >> Apr 01, 2023 11:54 AM Fuller Mandril wrote: Reason for CRM: Pt was instructed by provider to call once he had taken 3rd dose of WeGovy so dose could be increased. Pt took 3rd dose today 04/01/2023 and next dose is due next Friday. Uses [facility] - [address]

## 2023-04-04 ENCOUNTER — Other Ambulatory Visit (HOSPITAL_COMMUNITY): Payer: Self-pay

## 2023-04-04 NOTE — Telephone Encounter (Signed)
Patient advised.

## 2023-04-06 ENCOUNTER — Other Ambulatory Visit (HOSPITAL_COMMUNITY): Payer: Self-pay

## 2023-04-26 ENCOUNTER — Encounter: Payer: Self-pay | Admitting: Vascular Surgery

## 2023-04-26 ENCOUNTER — Other Ambulatory Visit: Payer: Self-pay | Admitting: *Deleted

## 2023-04-26 ENCOUNTER — Ambulatory Visit: Payer: Medicaid Other | Admitting: Vascular Surgery

## 2023-04-26 ENCOUNTER — Ambulatory Visit (HOSPITAL_COMMUNITY)
Admission: RE | Admit: 2023-04-26 | Discharge: 2023-04-26 | Disposition: A | Discharge status: Home / Self Care | Payer: Medicaid Other | Source: Ambulatory Visit | Attending: Vascular Surgery | Admitting: Vascular Surgery

## 2023-04-26 VITALS — BP 133/86 | HR 60 | Temp 98.4°F | Resp 20 | Ht 67.5 in | Wt 270.0 lb

## 2023-04-26 DIAGNOSIS — I82890 Acute embolism and thrombosis of other specified veins: Secondary | ICD-10-CM | POA: Insufficient documentation

## 2023-04-26 DIAGNOSIS — I872 Venous insufficiency (chronic) (peripheral): Secondary | ICD-10-CM

## 2023-04-26 DIAGNOSIS — M7989 Other specified soft tissue disorders: Secondary | ICD-10-CM

## 2023-04-26 NOTE — Progress Notes (Signed)
 [facility] [place] PROGRESS NOTE  ASSESSMENT / PLAN: Eddie Marshall is a 42 y.o. 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  Amend aspirin 81 mg by mouth daily indefinitely.  Recommend compression, elevation, weight loss, exercise for venous insufficiency.  Follow-up with me as needed.  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  The patient had an episode of thrombophlebitis which resolved after 45-day treatment with DOAC.  He has since discontinued his therapy and not restarted any antithrombotic therapy.  OBJECTIVE: BP 133/86 (BP Location: Left Arm, Patient Position: Sitting)   Pulse 60   Temp 98.4 F (36.9 C)   Resp 20   Ht 5' 7.5 (1.715 m)   Wt 270 lb (122.5 kg)   SpO2 97%   BMI 41.66 kg/m   Middle-age man in no distress Regular rate and rhythm Unlabored breathing 2+ PT pulses Significant varicosities of bilateral lower extremities Superficial thrombophlebitis of the right lateral thigh and knee     Latest Ref Rng & Units 08/16/2022   11:28 AM 01/21/2016    5:14 PM  CBC  WBC 3.4 - 10.8 x10E3/uL 8.3  9.5   Hemoglobin 13.0 - 17.7 g/dL 83.7  83.7   Hematocrit 37.5 - 51.0 % 48.8  47.9   Platelets 150 - 450 x10E3/uL 261  213         Latest Ref Rng & Units 09/16/2022   11:26 AM 08/16/2022   11:28 AM 01/21/2016    5:14 PM  CMP  Glucose 70 - 99 mg/dL 88  99  79   BUN 6 - 24 mg/dL 14  16  13    Creatinine 0.76 - 1.27 mg/dL 9.22  9.23  9.31   Sodium 134 - 144 mmol/L 142  141  139   Potassium 3.5 - 5.2 mmol/L 4.1  3.5  3.7   Chloride 96 - 106 mmol/L 104  101  104   CO2 20 - 29 mmol/L 25  22  30    Calcium 8.7 - 10.2 mg/dL 9.3  9.2  8.8   Total Protein 6.0 - 8.5 g/dL 7.2  7.4    Total Bilirubin 0.0 - 1.2 mg/dL 1.4  1.5    Alkaline Phos 44 - 121 IU/L 57  68    AST 0 - 40 IU/L 21  17     ALT 0 - 44 IU/L 23  23     Bilateral lower extremity venous reflux duplex:  Right:  - No evidence of deep vein thrombosis seen in the right lower extremity,  from the common femoral through the popliteal veins.  - Evidence of acute superficial thrombus within the varicosities of the  posterior thigh and extends through the lateral knee.  - Venous reflux identified in the common femoral and femoral veins.  - Venous reflux identified in the saphenofemoral junction, great saphenous  vein (thigh, knee, calf) and associated varicosities.    Left:  - No evidence of deep vein thrombosis seen in the left lower extremity,  from the common femoral through the popliteal veins.  - Evidence of chronic superficial thrombus changes within the varicosities  of the medial distal thigh/knee.  - Venous reflux identified in the common femoral, femoral and popliteal  veins.  - Venous reflux identified in the saphenofemoral junction, great saphenous  vein (thigh, knee, calf) and associated varicosities.      Debby SAILOR. Magda, MD FACS  [facility] Phone Number: [phone] 04/26/2023 4:13 PM

## 2023-05-02 ENCOUNTER — Telehealth: Payer: Self-pay

## 2023-05-02 ENCOUNTER — Other Ambulatory Visit (HOSPITAL_COMMUNITY): Payer: Self-pay

## 2023-05-02 DIAGNOSIS — G4733 Obstructive sleep apnea (adult) (pediatric): Secondary | ICD-10-CM

## 2023-05-02 MED ORDER — SEMAGLUTIDE-WEIGHT MANAGEMENT 2.4 MG/0.75ML ~~LOC~~ SOAJ
2.4000 mg | SUBCUTANEOUS | 2 refills | Status: DC
  Filled 2023-05-02: qty 3, 28d supply, fill #0

## 2023-05-02 NOTE — Telephone Encounter (Signed)
 Copied from CRM [id number]. Topic: Clinical - Medication Question >> Apr 29, 2023  3:44 PM Montie POUR wrote: Reason for CRM: Domingo called to left Dr. Melvenia know that he has taken his 3rd dose of Wegovy . He is at the 1.7 MG. Does he need to increase dose. Please call him at [phone] or Message through MyChart. He is not having any problems

## 2023-05-02 NOTE — Telephone Encounter (Signed)
 Patient advised.

## 2023-05-18 ENCOUNTER — Ambulatory Visit: Payer: Self-pay | Admitting: Internal Medicine

## 2023-05-18 NOTE — Telephone Encounter (Signed)
  Chief Complaint: dizziness Symptoms: Urinary frequency, Fever/chills, H/A x1, bilateral shoulder pain x1 Frequency: 1 week Pertinent Negatives: Patient denies injuries, SOB, chest pain, numbness/tingling Disposition: [] ED /[x] Urgent Care (no appt availability in office) / [] Appointment(In office/virtual)/ []  [place] [facility]/ [] Home Care/ [] Refused Recommended Disposition /[] [place] [facility]/ []  Follow-up with PCP Additional Notes: Patient c/o dizziness x 1 week with visual disturbances. Denies injuries, SOB, chest pain, numbness/tingling, current fever. Reports that he also has some urinary frequency and  bilateral shoulder pain, but unknown if r/t CC. Pt also endorses that he has concern for BP medication dosage. Scheduled patient per protocol on 05/19/23 at [facility]. Patient verbalized understanding and to call back with worsening symptoms.     Reason for Disposition  [1] MODERATE dizziness (e.g., interferes with normal activities) AND [2] has NOT been evaluated by doctor (or NP/PA) for this  (Exception: Dizziness caused by heat exposure, sudden standing, or poor fluid intake.)  Answer Assessment - Initial Assessment Questions 1. DESCRIPTION: "Describe your dizziness."     Eyes will go out of focus for a few seconds 2. LIGHTHEADED: "Do you feel lightheaded?" (e.g., somewhat faint, woozy, weak upon standing)     Feels like it's drilling in my head A little lightheadedness, but more dizziness 3. VERTIGO: "Do you feel like either you or the room is spinning or tilting?" (i.e. vertigo)     no 4. SEVERITY: "How bad is it?"  "Do you feel like you are going to faint?" "Can you stand and walk?"   - MILD: Feels slightly dizzy, but walking normally.   - MODERATE: Feels unsteady when walking, but not falling; interferes with normal activities (e.g., school, work).   - SEVERE: Unable to walk without falling, or requires assistance to walk without falling; feels like passing out now.       moderate 5. ONSET:  "When did the dizziness begin?"     A week ago 6. AGGRAVATING FACTORS: "Does anything make it worse?" (e.g., standing, change in head position)     Sitting down  7. HEART RATE: "Can you tell me your heart rate?" "How many beats in 15 seconds?"  (Note: not all patients can do this)       Don't notice a difference Chest hurting for a few days but that went away quickly 8. CAUSE: "What do you think is causing the dizziness?"     In the past, when he gets up too fast, he gets dizzy. But this time it is beyond that situation Reports concern for BP medication dosing affecting dizziness 9. RECURRENT SYMPTOM: "Have you had dizziness before?" If Yes, ask: "When was the last time?" "What happened that time?"     Not like this 10. OTHER SYMPTOMS: "Do you have any other symptoms?" (e.g., fever, chest pain, vomiting, diarrhea, bleeding)       Urinary frequency - every 10-20 mins, up to an hour Fever x 1 - had chills Head was on fire - took arthiritis pain killers that helped a little Reports bilateral shoulder pain x 1  Protocols used: Dizziness - Lightheadedness-A-AH

## 2023-05-18 NOTE — Telephone Encounter (Unsigned)
Copied from CRM [id number]. Topic: Clinical - Red Word Triage >> May 18, 2023  4:37 PM Eunice Blase wrote: Red Word that prompted transfer to Nurse Triage: Pt is having dizziness for a week.

## 2023-05-19 ENCOUNTER — Ambulatory Visit
Admission: EM | Admit: 2023-05-19 | Discharge: 2023-05-19 | Discharge status: Against Medical Advice | Payer: Medicaid Other

## 2023-05-19 ENCOUNTER — Inpatient Hospital Stay (HOSPITAL_COMMUNITY)
Admission: EM | Admit: 2023-05-19 | Discharge: 2023-05-23 | DRG: 872 | Disposition: A | Discharge status: Home / Self Care | Payer: Medicaid Other | Attending: Internal Medicine | Admitting: Internal Medicine

## 2023-05-19 ENCOUNTER — Ambulatory Visit: Payer: Self-pay

## 2023-05-19 ENCOUNTER — Other Ambulatory Visit: Payer: Self-pay

## 2023-05-19 ENCOUNTER — Encounter (HOSPITAL_COMMUNITY): Payer: Self-pay

## 2023-05-19 ENCOUNTER — Ambulatory Visit: Payer: Self-pay | Admitting: Internal Medicine

## 2023-05-19 ENCOUNTER — Emergency Department (HOSPITAL_COMMUNITY): Payer: Medicaid Other

## 2023-05-19 DIAGNOSIS — Z888 Allergy status to other drugs, medicaments and biological substances status: Secondary | ICD-10-CM | POA: Diagnosis not present

## 2023-05-19 DIAGNOSIS — Z8249 Family history of ischemic heart disease and other diseases of the circulatory system: Secondary | ICD-10-CM

## 2023-05-19 DIAGNOSIS — E876 Hypokalemia: Secondary | ICD-10-CM | POA: Diagnosis present

## 2023-05-19 DIAGNOSIS — R339 Retention of urine, unspecified: Secondary | ICD-10-CM | POA: Insufficient documentation

## 2023-05-19 DIAGNOSIS — A4150 Gram-negative sepsis, unspecified: Secondary | ICD-10-CM

## 2023-05-19 DIAGNOSIS — N35011 Post-traumatic bulbous urethral stricture: Secondary | ICD-10-CM | POA: Diagnosis present

## 2023-05-19 DIAGNOSIS — I959 Hypotension, unspecified: Secondary | ICD-10-CM | POA: Diagnosis not present

## 2023-05-19 DIAGNOSIS — N3001 Acute cystitis with hematuria: Secondary | ICD-10-CM | POA: Diagnosis present

## 2023-05-19 DIAGNOSIS — G40909 Epilepsy, unspecified, not intractable, without status epilepticus: Secondary | ICD-10-CM | POA: Diagnosis present

## 2023-05-19 DIAGNOSIS — Z6841 Body Mass Index (BMI) 40.0 and over, adult: Secondary | ICD-10-CM

## 2023-05-19 DIAGNOSIS — I4891 Unspecified atrial fibrillation: Secondary | ICD-10-CM | POA: Diagnosis not present

## 2023-05-19 DIAGNOSIS — Z7985 Long-term (current) use of injectable non-insulin antidiabetic drugs: Secondary | ICD-10-CM

## 2023-05-19 DIAGNOSIS — N390 Urinary tract infection, site not specified: Secondary | ICD-10-CM

## 2023-05-19 DIAGNOSIS — I10 Essential (primary) hypertension: Secondary | ICD-10-CM | POA: Diagnosis present

## 2023-05-19 DIAGNOSIS — R42 Dizziness and giddiness: Secondary | ICD-10-CM

## 2023-05-19 DIAGNOSIS — R338 Other retention of urine: Secondary | ICD-10-CM | POA: Diagnosis not present

## 2023-05-19 DIAGNOSIS — E66813 Obesity, class 3: Secondary | ICD-10-CM | POA: Diagnosis present

## 2023-05-19 DIAGNOSIS — I480 Paroxysmal atrial fibrillation: Secondary | ICD-10-CM | POA: Diagnosis present

## 2023-05-19 DIAGNOSIS — Z833 Family history of diabetes mellitus: Secondary | ICD-10-CM | POA: Diagnosis not present

## 2023-05-19 DIAGNOSIS — I493 Ventricular premature depolarization: Secondary | ICD-10-CM | POA: Diagnosis present

## 2023-05-19 HISTORY — DX: Gram-negative sepsis, unspecified: N39.0

## 2023-05-19 HISTORY — DX: Gram-negative sepsis, unspecified: A41.50

## 2023-05-19 LAB — CBC WITH DIFFERENTIAL/PLATELET
Abs Immature Granulocytes: 0.02 10*3/uL (ref 0.00–0.07)
Basophils Absolute: 0 10*3/uL (ref 0.0–0.1)
Basophils Relative: 0 %
Eosinophils Absolute: 0.1 10*3/uL (ref 0.0–0.5)
Eosinophils Relative: 1 %
HCT: 47.2 % (ref 39.0–52.0)
Hemoglobin: 15.7 g/dL (ref 13.0–17.0)
Immature Granulocytes: 0 %
Lymphocytes Relative: 33 %
Lymphs Abs: 2.6 10*3/uL (ref 0.7–4.0)
MCH: 29.5 pg (ref 26.0–34.0)
MCHC: 33.3 g/dL (ref 30.0–36.0)
MCV: 88.7 fL (ref 80.0–100.0)
Monocytes Absolute: 0.8 10*3/uL (ref 0.1–1.0)
Monocytes Relative: 10 %
Neutro Abs: 4.3 10*3/uL (ref 1.7–7.7)
Neutrophils Relative %: 56 %
Platelets: 220 10*3/uL (ref 150–400)
RBC: 5.32 MIL/uL (ref 4.22–5.81)
RDW: 13.7 % (ref 11.5–15.5)
WBC: 7.9 10*3/uL (ref 4.0–10.5)
nRBC: 0 % (ref 0.0–0.2)

## 2023-05-19 LAB — URINALYSIS, ROUTINE W REFLEX MICROSCOPIC
Bilirubin Urine: NEGATIVE
Glucose, UA: NEGATIVE mg/dL
Ketones, ur: NEGATIVE mg/dL
Nitrite: NEGATIVE
Protein, ur: 100 mg/dL — AB
RBC / HPF: >50 RBC/hpf (ref 0–5)
Specific Gravity, Urine: 1.024 (ref 1.005–1.030)
WBC, UA: >50 WBC/hpf (ref 0–5)
pH: 7 (ref 5.0–8.0)

## 2023-05-19 LAB — BASIC METABOLIC PANEL
Anion gap: 15 (ref 5–15)
BUN: 32 mg/dL — ABNORMAL HIGH (ref 6–20)
CO2: 20 mmol/L — ABNORMAL LOW (ref 22–32)
Calcium: 9.2 mg/dL (ref 8.9–10.3)
Chloride: 107 mmol/L (ref 98–111)
Creatinine, Ser: 0.93 mg/dL (ref 0.61–1.24)
GFR, Estimated: >60 mL/min (ref >60)
Glucose, Bld: 90 mg/dL (ref 70–99)
Potassium: 3.1 mmol/L — ABNORMAL LOW (ref 3.5–5.1)
Sodium: 142 mmol/L (ref 135–145)

## 2023-05-19 LAB — MAGNESIUM: Magnesium: 1.9 mg/dL (ref 1.7–2.4)

## 2023-05-19 MED ORDER — SODIUM CHLORIDE 0.9 % IV SOLN
2.0000 g | INTRAVENOUS | Status: DC
  Administered 2023-05-20 – 2023-05-21 (×2): 2 g via INTRAVENOUS
  Filled 2023-05-19 (×2): qty 20

## 2023-05-19 MED ORDER — TRAZODONE HCL 50 MG PO TABS: 25.0000 mg | ORAL_TABLET | Freq: Every evening | ORAL | Status: DC | PRN

## 2023-05-19 MED ORDER — SODIUM CHLORIDE 0.9 % IV SOLN
2.0000 g | Freq: Once | INTRAVENOUS | Status: AC
  Administered 2023-05-19: 2 g via INTRAVENOUS
  Filled 2023-05-19: qty 20

## 2023-05-19 MED ORDER — ENOXAPARIN SODIUM 60 MG/0.6ML IJ SOSY
60.0000 mg | PREFILLED_SYRINGE | Freq: Every day | INTRAMUSCULAR | Status: DC
  Administered 2023-05-19 – 2023-05-22 (×4): 60 mg via SUBCUTANEOUS
  Filled 2023-05-19 (×4): qty 0.6

## 2023-05-19 MED ORDER — ALBUTEROL SULFATE HFA 108 (90 BASE) MCG/ACT IN AERS: 2.0000 | INHALATION_SPRAY | Freq: Four times a day (QID) | RESPIRATORY_TRACT | Status: DC | PRN

## 2023-05-19 MED ORDER — MAGNESIUM HYDROXIDE 400 MG/5ML PO SUSP
30.0000 mL | Freq: Every day | ORAL | Status: DC | PRN
Start: 2023-05-19 — End: 2023-05-23
  Administered 2023-05-21 – 2023-05-22 (×2): 30 mL via ORAL
  Filled 2023-05-19 (×2): qty 30

## 2023-05-19 MED ORDER — ONDANSETRON HCL 4 MG PO TABS: 4.0000 mg | ORAL_TABLET | Freq: Four times a day (QID) | ORAL | Status: DC | PRN

## 2023-05-19 MED ORDER — SODIUM CHLORIDE 0.9 % IV BOLUS
1000.0000 mL | Freq: Once | INTRAVENOUS | Status: AC
  Administered 2023-05-19: 1000 mL via INTRAVENOUS

## 2023-05-19 MED ORDER — IRBESARTAN 75 MG PO TABS
37.5000 mg | ORAL_TABLET | Freq: Every day | ORAL | Status: DC
  Administered 2023-05-20 – 2023-05-23 (×4): 37.5 mg via ORAL
  Filled 2023-05-19 (×4): qty 1

## 2023-05-19 MED ORDER — POTASSIUM CHLORIDE 20 MEQ PO PACK
40.0000 meq | PACK | Freq: Once | ORAL | Status: AC
  Administered 2023-05-20: 40 meq via ORAL
  Filled 2023-05-19: qty 2

## 2023-05-19 MED ORDER — DILTIAZEM HCL 25 MG/5ML IV SOLN
10.0000 mg | Freq: Once | INTRAVENOUS | Status: AC
  Administered 2023-05-19: 10 mg via INTRAVENOUS
  Filled 2023-05-19: qty 5

## 2023-05-19 MED ORDER — ACETAMINOPHEN 325 MG PO TABS
650.0000 mg | ORAL_TABLET | Freq: Four times a day (QID) | ORAL | Status: DC | PRN
  Administered 2023-05-20 – 2023-05-21 (×2): 650 mg via ORAL
  Filled 2023-05-19 (×3): qty 2

## 2023-05-19 MED ORDER — ONDANSETRON HCL 4 MG/2ML IJ SOLN: 4.0000 mg | Freq: Four times a day (QID) | INTRAMUSCULAR | Status: DC | PRN

## 2023-05-19 MED ORDER — DILTIAZEM HCL-DEXTROSE 125-5 MG/125ML-% IV SOLN (PREMIX)
5.0000 mg/h | INTRAVENOUS | Status: DC
  Administered 2023-05-20: 5 mg/h via INTRAVENOUS
  Filled 2023-05-19 (×2): qty 125

## 2023-05-19 MED ORDER — LIDOCAINE 5 % EX PTCH
1.0000 | MEDICATED_PATCH | CUTANEOUS | Status: DC
  Administered 2023-05-21 – 2023-05-23 (×3): 1 via TRANSDERMAL
  Filled 2023-05-19 (×6): qty 1

## 2023-05-19 MED ORDER — POTASSIUM CHLORIDE 10 MEQ/100ML IV SOLN
10.0000 meq | Freq: Once | INTRAVENOUS | Status: AC
  Administered 2023-05-19: 10 meq via INTRAVENOUS
  Filled 2023-05-19: qty 100

## 2023-05-19 MED ORDER — METHOCARBAMOL 500 MG PO TABS
500.0000 mg | ORAL_TABLET | Freq: Four times a day (QID) | ORAL | Status: DC | PRN
  Administered 2023-05-20 – 2023-05-21 (×2): 500 mg via ORAL
  Filled 2023-05-19 (×2): qty 1

## 2023-05-19 MED ORDER — ACETAMINOPHEN 650 MG RE SUPP: 650.0000 mg | Freq: Four times a day (QID) | RECTAL | Status: DC | PRN

## 2023-05-19 MED ORDER — LACTATED RINGERS IV SOLN
150.0000 mL/h | INTRAVENOUS | Status: AC
  Administered 2023-05-20 (×2): 150 mL/h via INTRAVENOUS

## 2023-05-19 MED ORDER — POTASSIUM CHLORIDE CRYS ER 20 MEQ PO TBCR
40.0000 meq | EXTENDED_RELEASE_TABLET | Freq: Once | ORAL | Status: AC
  Administered 2023-05-19: 40 meq via ORAL
  Filled 2023-05-19: qty 2

## 2023-05-19 NOTE — Telephone Encounter (Signed)
Outbound call attempt #1 regarding patient's request for call to clarify appt. LVM for callback. Copied from CRM [id number]. Topic: General - Other >> May 19, 2023  1:03 PM Twila L wrote: Reason for CRM: Patient stts someone from Triage was suppose to call him back with appt .

## 2023-05-19 NOTE — ED Notes (Signed)
Patient is being discharged from the Urgent Care and sent to the Emergency Department via EMS . Per PA, patient is in need of higher level of care due to abnormal EKG. Patient is aware and verbalizes understanding of plan of care.  Vitals:   05/19/23 1337  BP: (!) 85/58  Pulse: 65  Resp: 18  Temp: 98 F (36.7 C)  SpO2: 94%

## 2023-05-19 NOTE — ED Triage Notes (Signed)
Pt arrived via POV c/o A Fib with RVR. Pt endorses dizzy spells, intermittent chest pain.

## 2023-05-19 NOTE — ED Provider Notes (Signed)
[facility]    CSN: [id number] Arrival date & time: 05/19/23  1317      History   Chief Complaint No chief complaint on file.   HPI Eddie Marshall is a 43 y.o. male.   Patient presenting today with 1 week history of dizziness, intermittent chest pain and around the same time started having urinary retention, hesitancy.  Denies known fever, chills, body aches, shortness of breath, palpitations, vomiting, diarrhea.  Denies past history of any of these issues.  Past medical history significant for arthritis, hypertension managed with Benicar, seizure disorder, superficial vein thrombosis for which she is taking a baby aspirin daily, sleep apnea.  Not trying anything over-the-counter for symptoms.    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   Snoring 08/13/2022   Persistent cough 08/13/2022   Dizziness 08/13/2022   Encounter for general adult medical examination with abnormal findings 08/13/2022    Past Surgical History:  Procedure Laterality Date   CYSTOSCOPY WITH RETROGRADE URETHROGRAM N/A 02/06/2016   Procedure: CYSTOSCOPY WITH RETROGRADE URETHROGRAM;  Surgeon: Bjorn Pippin, MD;  Location: [facility];  Service: Urology;  Laterality: N/A;   CYSTOSCOPY WITH URETHRAL DILATATION N/A 02/06/2016   Procedure: CYSTOSCOPY WITH URETHRAL DILATATION;  Surgeon: Bjorn Pippin, MD;  Location: [facility];  Service: Urology;  Laterality: N/A;   HIP SURGERY Left 1993 1995 1996   x 3    KNEE SURGERY Right 1999       Home Medications    Prior to Admission medications   Medication Sig Start Date End Date Taking? Authorizing Provider  albuterol (VENTOLIN HFA) 108 (90 Base) MCG/ACT inhaler Inhale 2 puffs into the lungs every 6 (six) hours as needed for wheezing or shortness of breath. 01/26/23   Billie Lade, MD  lidocaine (LIDODERM) 5 % Place 1 patch onto the skin daily. Remove & Discard patch within 12 hours or as directed by MD    [provider]  methocarbamol (ROBAXIN) 500 MG tablet Take 1 tablet (500 mg total) by mouth every 6 (six) hours as needed for muscle spasms. 12/13/22   Billie Lade, MD  olmesartan (BENICAR) 40 MG tablet Take 1 tablet (40 mg total) by mouth daily. 02/09/23   Billie Lade, MD  Semaglutide-Weight Management 2.4 MG/0.75ML SOAJ Inject 2.4 mg into the skin once a week. 05/02/23   Billie Lade, MD    Family History Family History  Problem Relation Age of Onset   Diabetes Mother    Hypertension Mother    Hypertension Father     Social History Social History   Tobacco Use   Smoking status: Never   Smokeless tobacco: Never  Substance Use Topics   Alcohol use: No   Drug use: No     Allergies   Other   Review of Systems Review of Systems Per HPI  Physical Exam Triage Vital Signs ED Triage Vitals  Encounter Vitals Group  BP 05/19/23 1337 (!) 85/58     Systolic BP Percentile --      Diastolic BP Percentile --      Pulse Rate 05/19/23 1337 65     Resp 05/19/23 1337 18     Temp 05/19/23 1337 98 F (36.7 C)     Temp Source 05/19/23 1337 Oral     SpO2 05/19/23 1337 94 %     Weight --      Height --      Head Circumference --      Peak Flow --      Pain Score 05/19/23 1341 0     Pain Loc --      Pain Education --      Exclude from Growth Chart --    Orthostatic VS for the past 24 hrs:  BP- Lying Pulse- Lying BP- Sitting Pulse- Sitting BP- Standing at 0 minutes Pulse- Standing at 0 minutes   05/19/23 1343 114/76 80 99/70 72 97/65 87    Updated Vital Signs BP (!) 85/58 (BP Location: Right Arm)   Pulse 65   Temp 98 F (36.7 C) (Oral)   Resp 18   SpO2 94%   Visual Acuity Right Eye Distance:   Left Eye Distance:   Bilateral Distance:    Right Eye Near:   Left Eye Near:    Bilateral Near:     Physical Exam Vitals and nursing note reviewed.  Constitutional:      Appearance: Normal appearance.  HENT:     Head: Atraumatic.     Mouth/Throat:     Mouth: Mucous membranes are moist.  Eyes:     Extraocular Movements: Extraocular movements intact.     Conjunctiva/sclera: Conjunctivae normal.  Cardiovascular:     Rate and Rhythm: Rhythm irregular.  Pulmonary:     Effort: Pulmonary effort is normal.  Musculoskeletal:        General: Normal range of motion.     Cervical back: Normal range of motion and neck supple.  Skin:    General: Skin is warm and dry.  Neurological:     General: No focal deficit present.     Mental Status: He is oriented to person, place, and time.  Psychiatric:        Mood and Affect: Mood normal.        Thought Content: Thought content normal.        Judgment: Judgment normal.      UC Treatments / Results  Labs (all labs ordered are listed, but only abnormal results are displayed) Labs Reviewed - No data to display  EKG   Radiology No results found.  Procedures Procedures (including critical care time)  Medications Ordered in UC Medications - No data to display  Initial Impression / Assessment and Plan / UC Course  I have reviewed the triage vital signs and the nursing notes.  Pertinent labs & imaging results that were available during my care of the patient were reviewed by me and considered in my medical decision making (see chart for details).     Hypotensive in triage, otherwise vital signs reassuring.  He appears in no acute distress today.  EKG today showing atrial fibrillation with RVR and nonspecific ST and T wave  changes.  No prior EKGs available for comparison, per patient no past history of heart rhythm issues or anything similar.  Discussed importance of going to the emergency department immediately and highly recommended EMS transport.  He firmly declines this.  In light of this, strongly recommended he call someone to give him a ride to the emergency department but he declines and wishes to go home and run an errand first prior to going to the emergency department.  Discussed this at length and the potential life-threatening dangers of this, he wishes to leave AMA and then drive himself to the hospital later.  Final Clinical Impressions(s) / UC Diagnoses   Final diagnoses:  Atrial fibrillation with RVR (HCC)  Dizziness  Urinary retention  Hypotension, unspecified hypotension type     Discharge Instructions      As we discussed, it is highly recommended that you go via EMS to the emergency department for your immediate evaluation.  At the very least, we highly recommend that you have someone else drive you immediately to the emergency department.  We do not recommend that you drive or delay your arrival to the emergency department for any reason.    ED Prescriptions   None    PDMP not reviewed this encounter.   Particia Nearing, [place] 05/19/23 1430

## 2023-05-19 NOTE — ED Provider Triage Note (Signed)
Emergency Medicine Provider Triage Evaluation Note  Eddie Marshall , a 43 y.o. male  was evaluated in triage.  Pt complains of dizzy, UTI sxs.  Review of Systems  Positive: Dizzy spells, CP (pain free now), difficult urination Negative: Fever, URI  Physical Exam  BP 107/70 (BP Location: Right Arm)   Pulse (!) 52   Temp 98 F (36.7 C) (Oral)   Resp 18   Ht 5' 7.5" (1.715 m)   Wt 118.8 kg   SpO2 100%   BMI 40.43 kg/m  Gen:   Awake, no distress   Resp:  Normal effort  MSK:   Moves extremities without difficulty  Other:    Medical Decision Making  Medically screening exam initiated at 4:52 PM.  Appropriate orders placed.  Eddie Marshall was informed that the remainder of the evaluation will be completed by another provider, this initial triage assessment does not replace that evaluation, and the importance of remaining in the ED until their evaluation is complete.  H/o a-fib. Urgent Care sent him here for a-fib in RVR. Here rate is 127. Also reports urinary frequency, does not feel he is emptying his bladder. He reports fever/chills on day 1 (one week ago, but none since).   Eddie Anis, PA-C 05/19/23 1654

## 2023-05-19 NOTE — Discharge Instructions (Addendum)
As we discussed, it is highly recommended that you go via EMS to the emergency department for your immediate evaluation.  At the very least, we highly recommend that you have someone else drive you immediately to the emergency department.  We do not recommend that you drive or delay your arrival to the emergency department for any reason.

## 2023-05-19 NOTE — ED Triage Notes (Signed)
Pt reports dizziness  x 1 week states some are mild and others are like TV shutting off with intermittent chest pain, Infrequent urination, feels like he is unable to urinate as usual. x 1 week all sx's started around the same time

## 2023-05-19 NOTE — ED Notes (Signed)
Provider reviewed risk and benefits of leaving UC at this time. Pt refused EMS and reported would go to ED after "taking care of things first". RN and PA urged pt to go to ED via EMS. Pt refused several times.

## 2023-05-19 NOTE — Telephone Encounter (Signed)
Patient currently being evaluated in the ED.  Copied from CRM [id number]. Topic: General - Other >> May 19, 2023  1:03 PM Twila L wrote: Reason for CRM: Patient stts someone from Triage was suppose to call him back with appt .

## 2023-05-19 NOTE — ED Notes (Signed)
Upon doing orthostatics pt states he recalls when standing to fast noticing dizziness.

## 2023-05-19 NOTE — ED Provider Notes (Signed)
[facility] EMERGENCY DEPARTMENT AT [facility] Provider Note   CSN: [id number] Arrival date & time: 05/19/23  1614     History  Chief Complaint  Patient presents with   Atrial Fibrillation    Eddie Marshall is a 43 y.o. male.  Patient complains of dysuria and urgency.  He also states he has had some dizziness for about a week.  Patient has history of hypertension  The history is provided by the patient and medical records. No language interpreter was used.  Dizziness Quality:  Lightheadedness Severity:  Mild Onset quality:  Sudden Timing:  Intermittent Progression:  Waxing and waning Chronicity:  New Context: not when bending over   Relieved by:  None tried Worsened by:  Nothing Ineffective treatments:  None tried Associated symptoms: no blood in stool, no chest pain, no diarrhea and no headaches        Home Medications Prior to Admission medications   Medication Sig Start Date End Date Taking? Authorizing Provider  albuterol (VENTOLIN HFA) 108 (90 Base) MCG/ACT inhaler Inhale 2 puffs into the lungs every 6 (six) hours as needed for wheezing or shortness of breath. 01/26/23   Billie Lade, MD  lidocaine (LIDODERM) 5 % Place 1 patch onto the skin daily. Remove & Discard patch within 12 hours or as directed by MD    [provider]  methocarbamol (ROBAXIN) 500 MG tablet Take 1 tablet (500 mg total) by mouth every 6 (six) hours as needed for muscle spasms. 12/13/22   Billie Lade, MD  olmesartan (BENICAR) 40 MG tablet Take 1 tablet (40 mg total) by mouth daily. 02/09/23   Billie Lade, MD  Semaglutide-Weight Management 2.4 MG/0.75ML SOAJ Inject 2.4 mg into the skin once a week. 05/02/23   Billie Lade, MD      Allergies    Other    Review of Systems   Review of Systems  Constitutional:  Negative for appetite change and fatigue.  HENT:  Negative for congestion, ear discharge and sinus pressure.   Eyes:  Negative for discharge.   Respiratory:  Negative for cough.   Cardiovascular:  Negative for chest pain.  Gastrointestinal:  Negative for abdominal pain, blood in stool and diarrhea.  Genitourinary:  Positive for urgency. Negative for frequency and hematuria.  Musculoskeletal:  Negative for back pain.  Skin:  Negative for rash.  Neurological:  Positive for dizziness. Negative for seizures and headaches.  Psychiatric/Behavioral:  Negative for hallucinations.     Physical Exam Updated Vital Signs BP 107/70 (BP Location: Right Arm)   Pulse (!) 52   Temp 98.5 F (36.9 C) (Oral)   Resp 18   Ht 5' 7.5" (1.715 m)   Wt 118.8 kg   SpO2 100%   BMI 40.43 kg/m  Physical Exam Vitals and nursing note reviewed.  Constitutional:      Appearance: He is well-developed.  HENT:     Head: Normocephalic.  Eyes:     General: No scleral icterus.    Conjunctiva/sclera: Conjunctivae normal.  Neck:     Thyroid: No thyromegaly.  Cardiovascular:     Rate and Rhythm: Tachycardia present. Rhythm irregular.     Heart sounds: No murmur heard.    No friction rub. No gallop.  Pulmonary:     Breath sounds: No stridor. No wheezing or rales.  Chest:     Chest wall: No tenderness.  Abdominal:     General: There is no distension.     Tenderness:  There is no abdominal tenderness. There is no rebound.  Musculoskeletal:        General: Normal range of motion.     Cervical back: Neck supple.  Lymphadenopathy:     Cervical: No cervical adenopathy.  Skin:    Findings: No erythema or rash.  Neurological:     Mental Status: He is alert and oriented to person, place, and time.     Motor: No abnormal muscle tone.     Coordination: Coordination normal.  Psychiatric:        Behavior: Behavior normal.     ED Results / Procedures / Treatments   Labs (all labs ordered are listed, but only abnormal results are displayed) Labs Reviewed  BASIC METABOLIC PANEL - Abnormal; Notable for the following components:      Result Value    Potassium 3.1 (*)    CO2 20 (*)    BUN 32 (*)    All other components within normal limits  URINALYSIS, ROUTINE W REFLEX MICROSCOPIC - Abnormal; Notable for the following components:   Color, Urine AMBER (*)    APPearance CLOUDY (*)    Hgb urine dipstick SMALL (*)    Protein, ur 100 (*)    Leukocytes,Ua MODERATE (*)    Bacteria, UA RARE (*)    All other components within normal limits  URINE CULTURE  CBC WITH DIFFERENTIAL/PLATELET    EKG EKG Interpretation Date/Time:  Thursday May 19 2023 16:52:09 EST Ventricular Rate:  127 PR Interval:    QRS Duration:  84 QT Interval:  304 QTC Calculation: 441 R Axis:   65  Text Interpretation: Atrial fibrillation with rapid ventricular response with premature ventricular or aberrantly conducted complexes ST & T wave abnormality, consider inferior ischemia ST & T wave abnormality, consider anterolateral ischemia Abnormal ECG When compared with ECG of 19-May-2023 13:55, Inverted T waves have replaced nonspecific T wave abnormality in Inferior leads Confirmed by Bethann Berkshire [phone]) on 05/19/2023 8:01:47 PM  Radiology DG Chest Port 1 View Result Date: 05/19/2023 CLINICAL DATA:  Weakness. EXAM: PORTABLE CHEST 1 VIEW COMPARISON:  None Available. FINDINGS: Upper normal heart size likely accentuated by portable AP technique.The cardiomediastinal contours are normal. The lungs are clear. Pulmonary vasculature is normal. No consolidation, pleural effusion, or pneumothorax. No acute osseous abnormalities are seen. IMPRESSION: No active disease. Electronically Signed   By: Narda Rutherford M.D.   On: 05/19/2023 21:28    Procedures Procedures    Medications Ordered in ED Medications  diltiazem (CARDIZEM) 125 mg in dextrose 5% 125 mL (1 mg/mL) infusion (has no administration in time range)  diltiazem (CARDIZEM) injection 10 mg (has no administration in time range)  sodium chloride 0.9 % bolus 1,000 mL (1,000 mLs Intravenous New Bag/Given 05/19/23  2101)  cefTRIAXone (ROCEPHIN) 2 g in sodium chloride 0.9 % 100 mL IVPB (0 g Intravenous Stopped 05/19/23 2149)  potassium chloride 10 mEq in 100 mL IVPB (10 mEq Intravenous New Bag/Given 05/19/23 2109)  potassium chloride SA (KLOR-CON M) CR tablet 40 mEq (40 mEq Oral Given 05/19/23 2100)    ED Course/ Medical Decision Making/ A&P CRITICAL CARE Performed by: Bethann Berkshire Total critical care time: 40 minutes Critical care time was exclusive of separately billable procedures and treating other patients. Critical care was necessary to treat or prevent imminent or life-threatening deterioration. Critical care was time spent personally by me on the following activities: development of treatment plan with patient and/or surrogate as well as nursing, discussions with consultants, evaluation of  patient's response to treatment, examination of patient, obtaining history from patient or surrogate, ordering and performing treatments and interventions, ordering and review of laboratory studies, ordering and review of radiographic studies, pulse oximetry and re-evaluation of patient's condition.  Click here for ABCD2, HEART and other calculatorsREFRESH Note before signing :1}                              Medical Decision Making Amount and/or Complexity of Data Reviewed Labs: ordered. Radiology: ordered.  Risk Prescription drug management. Decision regarding hospitalization.  This patient presents to the ED for concern of dizziness and difficulty urinating, this involves an extensive number of treatment options, and is a complaint that carries with it a high risk of complications and morbidity.  The differential diagnosis includes anemia, UTI   Co morbidities that complicate the patient evaluation  Hypertension   Additional history obtained:  Additional history obtained from patient External records from outside source obtained and reviewed including hospital records   Lab Tests:  I Ordered,  and personally interpreted labs.  The pertinent results include: Urinalysis suggests UTI   Imaging Studies ordered:  I ordered imaging studies including chest x-ray I independently visualized and interpreted imaging which showed negative I agree with the radiologist interpretation   Cardiac Monitoring: / EKG:  The patient was maintained on a cardiac monitor.  I personally viewed and interpreted the cardiac monitored which showed an underlying rhythm of: Atrial fibs with rapid rate   Consultations Obtained:  I requested consultation with the hospitalist,  and discussed lab and imaging findings as well as pertinent plan - they recommend: Admit   Problem List / ED Course / Critical interventions / Medication management  Atrial fibs and UTI I ordered medication including diltiazem for atrial fibs and Rocephin for UTI Reevaluation of the patient after these medicines showed that the patient improved I have reviewed the patients home medicines and have made adjustments as needed   Social Determinants of Health:  None   Test / Admission - Considered:  None  Patient with atrial fibrillation with RVR and urinary retention with UTI.  He is admitted to medicine        Final Clinical Impression(s) / ED Diagnoses Final diagnoses:  Atrial fibrillation with RVR (HCC)  Acute cystitis with hematuria    Rx / DC Orders ED Discharge Orders     None         Bethann Berkshire, MD 05/22/23 [phone]

## 2023-05-19 NOTE — ED Notes (Signed)
Pt also reports difficulty with urination, and having a known blood clot in his left leg.

## 2023-05-20 ENCOUNTER — Inpatient Hospital Stay (HOSPITAL_COMMUNITY): Payer: Medicaid Other

## 2023-05-20 DIAGNOSIS — I4891 Unspecified atrial fibrillation: Secondary | ICD-10-CM

## 2023-05-20 DIAGNOSIS — A4150 Gram-negative sepsis, unspecified: Secondary | ICD-10-CM | POA: Diagnosis not present

## 2023-05-20 DIAGNOSIS — R338 Other retention of urine: Secondary | ICD-10-CM

## 2023-05-20 DIAGNOSIS — I480 Paroxysmal atrial fibrillation: Secondary | ICD-10-CM | POA: Diagnosis not present

## 2023-05-20 DIAGNOSIS — E876 Hypokalemia: Secondary | ICD-10-CM

## 2023-05-20 DIAGNOSIS — N390 Urinary tract infection, site not specified: Secondary | ICD-10-CM

## 2023-05-20 DIAGNOSIS — R339 Retention of urine, unspecified: Secondary | ICD-10-CM | POA: Insufficient documentation

## 2023-05-20 DIAGNOSIS — I10 Essential (primary) hypertension: Secondary | ICD-10-CM

## 2023-05-20 DIAGNOSIS — N35011 Post-traumatic bulbous urethral stricture: Secondary | ICD-10-CM | POA: Diagnosis not present

## 2023-05-20 LAB — ECHOCARDIOGRAM COMPLETE
AR max vel: 3.23 cm2
AV Area VTI: 3.73 cm2
AV Area mean vel: 3.35 cm2
AV Mean grad: 2 mm[Hg]
AV Peak grad: 4.4 mm[Hg]
Ao pk vel: 1.05 m/s
Area-P 1/2: 3.93 cm2
Calc EF: 57.3 %
Height: 67.5 in
MV VTI: 3.72 cm2
S' Lateral: 3 cm
Single Plane A2C EF: 60.3 %
Single Plane A4C EF: 51.6 %
Weight: 4192 [oz_av]

## 2023-05-20 LAB — CBC
HCT: 44.2 % (ref 39.0–52.0)
Hemoglobin: 14.6 g/dL (ref 13.0–17.0)
MCH: 29.6 pg (ref 26.0–34.0)
MCHC: 33 g/dL (ref 30.0–36.0)
MCV: 89.5 fL (ref 80.0–100.0)
Platelets: 161 10*3/uL (ref 150–400)
RBC: 4.94 MIL/uL (ref 4.22–5.81)
RDW: 13.6 % (ref 11.5–15.5)
WBC: 7.4 10*3/uL (ref 4.0–10.5)
nRBC: 0 % (ref 0.0–0.2)

## 2023-05-20 LAB — PROTIME-INR
INR: 1.1 (ref 0.8–1.2)
Prothrombin Time: 14.8 s (ref 11.4–15.2)

## 2023-05-20 LAB — BASIC METABOLIC PANEL
Anion gap: 7 (ref 5–15)
BUN: 24 mg/dL — ABNORMAL HIGH (ref 6–20)
CO2: 21 mmol/L — ABNORMAL LOW (ref 22–32)
Calcium: 8.2 mg/dL — ABNORMAL LOW (ref 8.9–10.3)
Chloride: 113 mmol/L — ABNORMAL HIGH (ref 98–111)
Creatinine, Ser: 0.72 mg/dL (ref 0.61–1.24)
GFR, Estimated: >60 mL/min (ref >60)
Glucose, Bld: 97 mg/dL (ref 70–99)
Potassium: 3.4 mmol/L — ABNORMAL LOW (ref 3.5–5.1)
Sodium: 141 mmol/L (ref 135–145)

## 2023-05-20 LAB — GLUCOSE, CAPILLARY: Glucose-Capillary: 91 mg/dL (ref 70–99)

## 2023-05-20 LAB — CORTISOL-AM, BLOOD: Cortisol - AM: 6.6 ug/dL — ABNORMAL LOW (ref 6.7–22.6)

## 2023-05-20 LAB — MRSA NEXT GEN BY PCR, NASAL: MRSA by PCR Next Gen: NOT DETECTED

## 2023-05-20 MED ORDER — POTASSIUM CHLORIDE CRYS ER 20 MEQ PO TBCR
40.0000 meq | EXTENDED_RELEASE_TABLET | Freq: Once | ORAL | Status: AC
  Administered 2023-05-20: 40 meq via ORAL
  Filled 2023-05-20: qty 2

## 2023-05-20 MED ORDER — MAGNESIUM SULFATE IN D5W 1-5 GM/100ML-% IV SOLN
1.0000 g | Freq: Once | INTRAVENOUS | Status: AC
  Administered 2023-05-20: 1 g via INTRAVENOUS
  Filled 2023-05-20: qty 100

## 2023-05-20 MED ORDER — ALBUTEROL SULFATE (2.5 MG/3ML) 0.083% IN NEBU: 2.5000 mg | INHALATION_SOLUTION | Freq: Four times a day (QID) | RESPIRATORY_TRACT | Status: DC | PRN

## 2023-05-20 MED ORDER — HYDROMORPHONE HCL 1 MG/ML IJ SOLN
1.0000 mg | Freq: Once | INTRAMUSCULAR | Status: AC
  Administered 2023-05-20: 1 mg via INTRAVENOUS
  Filled 2023-05-20: qty 1

## 2023-05-20 MED ORDER — HYDROMORPHONE HCL 1 MG/ML IJ SOLN
INTRAMUSCULAR | Status: AC
  Administered 2023-05-20: 1 mg
  Filled 2023-05-20: qty 1

## 2023-05-20 MED ORDER — HYDROMORPHONE HCL 1 MG/ML IJ SOLN
1.0000 mg | INTRAMUSCULAR | Status: DC | PRN
  Administered 2023-05-20 – 2023-05-21 (×2): 1 mg via INTRAVENOUS
  Filled 2023-05-20 (×2): qty 1

## 2023-05-20 MED ORDER — LIDOCAINE HCL (CARDIAC) PF 100 MG/5ML IV SOSY
PREFILLED_SYRINGE | INTRAVENOUS | Status: AC
  Filled 2023-05-20: qty 5

## 2023-05-20 MED ORDER — LORAZEPAM 2 MG/ML IJ SOLN
0.5000 mg | Freq: Once | INTRAMUSCULAR | Status: AC
Start: 2023-05-20 — End: 2023-05-20
  Administered 2023-05-20: 0.5 mg via INTRAVENOUS
  Filled 2023-05-20: qty 1

## 2023-05-20 NOTE — Assessment & Plan Note (Addendum)
-   This could be related to BPH. - The patient had several attempts at insertion of Foley an coud catheter without success. - He initially had 330 mL and later 660 mL per bladder scan. - I discussed the case with Dr. Sande Brothers who recommended pain medications to help with Foley catheter insertion specially with associated hematuria. - The patient was given 1 mg of IV Dilaudid and Ativan and he was more comfortable. - Dr. Sande Brothers recommended awaiting Dr. Ronne Binning to insert it in a.m. if the patient is more comfortable. - A consult was placed to Dr. Ronne Binning and he can be called in AM.

## 2023-05-20 NOTE — Progress Notes (Signed)
  Echocardiogram 2D Echocardiogram has been performed.  Ocie Doyne RDCS 05/20/2023, 12:42 PM

## 2023-05-20 NOTE — Plan of Care (Signed)
?  Problem: Fluid Volume: ?Goal: Hemodynamic stability will improve ?Outcome: Progressing ?  ?Problem: Clinical Measurements: ?Goal: Diagnostic test results will improve ?Outcome: Progressing ?Goal: Signs and symptoms of infection will decrease ?Outcome: Progressing ?  ?Problem: Respiratory: ?Goal: Ability to maintain adequate ventilation will improve ?Outcome: Progressing ?  ?Problem: Education: ?Goal: Knowledge of General Education information will improve ?Description: Including pain rating scale, medication(s)/side effects and non-pharmacologic comfort measures ?Outcome: Progressing ?  ?Problem: Health Behavior/Discharge Planning: ?Goal: Ability to manage health-related needs will improve ?Outcome: Progressing ?  ?Problem: Clinical Measurements: ?Goal: Ability to maintain clinical measurements within normal limits will improve ?Outcome: Progressing ?Goal: Will remain free from infection ?Outcome: Progressing ?Goal: Diagnostic test results will improve ?Outcome: Progressing ?Goal: Respiratory complications will improve ?Outcome: Progressing ?Goal: Cardiovascular complication will be avoided ?Outcome: Progressing ?  ?Problem: Activity: ?Goal: Risk for activity intolerance will decrease ?Outcome: Progressing ?  ?Problem: Nutrition: ?Goal: Adequate nutrition will be maintained ?Outcome: Progressing ?  ?Problem: Coping: ?Goal: Level of anxiety will decrease ?Outcome: Progressing ?  ?Problem: Elimination: ?Goal: Will not experience complications related to bowel motility ?Outcome: Progressing ?Goal: Will not experience complications related to urinary retention ?Outcome: Progressing ?  ?Problem: Safety: ?Goal: Ability to remain free from injury will improve ?Outcome: Progressing ?  ?Problem: Skin Integrity: ?Goal: Risk for impaired skin integrity will decrease ?Outcome: Progressing ?  ?

## 2023-05-20 NOTE — Progress Notes (Signed)
Received report from [employer]. This Investment banker, operational will be taking over care.

## 2023-05-20 NOTE — Assessment & Plan Note (Signed)
-   We will continue antihypertensive therapy.

## 2023-05-20 NOTE — Assessment & Plan Note (Addendum)
-   The patient is admitted to stepdown unit bed for further monitoring. - We will continue IV Cardizem drip. - We will obtain a 2D echo and cardiology consult can be called in AM. - The patient's CHA2DS2-VASc score at this time is 1.

## 2023-05-20 NOTE — Assessment & Plan Note (Signed)
-   Potassium will be replaced and magnesium level will be checked. ?

## 2023-05-20 NOTE — Progress Notes (Signed)
Progress Note   Patient: Eddie Marshall WUJ:[id number] DOB: 1981/02/19 DOA: 05/19/2023     1 DOS: the patient was seen and examined on 05/20/2023   Brief hospital admission course: As per H&P written by Dr. Jarome Lamas on 05/19/2023 [name] is a 43 y.o. male with medical history significant for essential hypertension, seizure disorder and osteoarthritis, who presented to the emergency room with acute onset of mild dysuria with restricted urine outflow, without abdominal pain or fever or chills.  He denied any nausea or vomiting.  No hematuria or flank pain.  No chest pain or palpitations.  No cough or wheezing or dyspnea.  No headache or dizziness or blurred vision.  No other bleeding diathesis.   ED Course: When came to the ER, heart rate was 101 and later 106 with respiratory rate 22 and later 30 with otherwise normal vital signs..  Labs revealed hypokalemia of 3.1 and a CO2 of 20 with a BUN of 32 and creatinine 0.93.  CBC with within normal.  UA was positive for UTI with more than 50 RBCs and more than 50 WBCs and moderate leukocytes.  Urine culture was sent. EKG as reviewed by me : initial EKG showed atrial fibrillation with rapid ventricular sponsor of 102 with premature ventricular complexes and T wave inversion laterally.  Repeat EKG showed rate going up to 130. Imaging: Chest x-ray showed no acute cardiopulmonary disease.   The patient was given 2 g of IV Rocephin, 92M EQ p.o and 10 mEq IV. potassium chloride, IV Cardizem 10 mg bolus followed by drip and 1 L bolus of IV normal saline.  He will be admitted to a stepdown unit bed for further evaluation and management.  Assessment and Plan: * Sepsis due to UTI - Continue to maintain adequate hydration -Continue to cleanse IV Rocephin daily and follow culture results -Continue as needed antipyretics and antiemetics -Follow clinical response.  Paroxysmal atrial fibrillation with RVR (HCC) - The patient is admitted to stepdown unit bed for further  monitoring. - Has cardioverted on his own -Cardizem drip has been discontinued; patient will be transferred to telemetry bed -CHA2DS2-VASc score 1; no need for long-term anticoagulation. -Will check 2D echo. - Patient reports no chest pain no palpitations.  Acute urinary retention/urethral traumatic injury - This could be related to BPH and/or acute prostatitis. -Continue current antibiotics -Follow culture results -Video-assisted cystoscopy with placement of Foley catheter by urology; will follow any further recommendations -Chlamydia gonorrhea urine probe has been requested.. -Continue as needed analgesics -Follow hemoglobin trend.  Hypokalemia/hypomagnesemia -Electrolytes have been repleted -Continue telemetry monitoring -Follow electrolytes trend and further replete as required..  Essential hypertension - Continue current antihypertensive agent -Heart healthy/low-sodium diet discussed with patient -Maintain adequate hydration -Follow vital signs.   Subjective:  No fever, no chest pain, no palpitations; active bright red blood per rectum through his meatus and ongoing urinary retention with residual bladder scan demonstrating over 1000 mL.  Patient seen by urology service with Foley catheter placement after a stricture or dilatation and video cystoscopy at bedside.  Patient denies nausea or vomiting; currently has cardioverted on his own and is in sinus rhythm.  Physical Exam: Vitals:   05/20/23 1200 05/20/23 1300 05/20/23 1400 05/20/23 1500  BP: 107/65 108/68 101/60 123/74  Pulse: 67     Resp: 17 18 18  (!) 21  Temp:      TempSrc:      SpO2: 95% 98%    Weight:      Height:  General exam: Alert, awake, oriented x 3; no chest pain, no nausea, no vomiting, no palpitations.  Patient is currently afebrile. Respiratory system: Clear to auscultation. Respiratory effort normal.  Good saturation on room air. Cardiovascular system: Rate control and sinus rhythm at time of  examination; no rubs or gallops.  No JVD on exam. Gastrointestinal system: Abdomen is obese, nondistended, soft and nontender. No organomegaly or masses felt.  Positive bowel sounds. GU: Active bright red blood appreciated coming out of his meatus; Foley catheter placed by urology service with good urinary return and no presence of blood coming from catheter (suggesting urethral trauma). Central nervous system: Alert and oriented. No focal neurological deficits. Extremities: No cyanosis or clubbing. Skin: No petechiae. Psychiatry:  Mood & affect appropriate.   Data Reviewed: MRSA PCR: Not detected Cortisol: 6.6 Basic metabolic panel: Sodium 141, potassium 3.4, chloride 113, bicarb 21, glucose 97, BUN 24, creatinine 0.72 and GFR >60 CBC: WBC 7.4, hemoglobin 14.6 and platelet count 161K GC chlamydia urine probe: Pending HIV antibody: Pending    Family Communication: No family at bedside.  Disposition: Status is: Inpatient Remains inpatient appropriate because: Continue IV therapy.   Planned Discharge Destination: Home   Time spent: 50 minutes  Author: Vassie Loll, MD 05/20/2023 4:25 PM  For on call review [email or web address].

## 2023-05-20 NOTE — Progress Notes (Signed)
Ultrasound was done on patient's bladder. The foley placed on nightshift was not in the bladder. 600 ml of blood in the bag. 992 ml of urine residual in the bladder per the ultrasound machine. Foley was removed and a continuous stream of blood was noted coming from the pt's penis. Towels were placed along with a sandbag weight. Dr. Gwenlyn Perking MD was notified immediately via safe chat. Dr. Serita Kyle from urology came and placed wire guided catheter. The towels and bed had a significant amount of blood. Estimate somewhere around 400-500 ml. Urine in the bag is now amber/clear. Some bleeding still observed coming out from around the catheter at the tip of his penis. Pt states the pain/discomfort is much better now. 1 mg dilaudid given prior to foley insertion per Dr. Kathalene Frames order.

## 2023-05-20 NOTE — Assessment & Plan Note (Signed)
-   The patient is admitted to stepdown unit bed for further monitoring. - We will continue IV Cardizem drip. - We will obtain a 2D echo and cardiology consult can be called in AM. - The patient's CHA2DS2-VASc score at this time is 1.

## 2023-05-20 NOTE — Assessment & Plan Note (Signed)
-   The patient will be admitted to a stepdown unit bed. - We will continue Bactrim.  IV Rocephin. - We will follow urine culture and sensitivity. - The patient will be hydrated with IV normal saline. - Sepsis is manifested by tachycardia and tachypnea.

## 2023-05-20 NOTE — TOC CM/SW Note (Signed)
Transition of Care [facility]) - Inpatient Brief Assessment   Patient Details  Name: Eddie Marshall MRN: [id number] Date of Birth: 1980-07-31  Transition of Care [facility]) CM/SW Contact:    Isabella Bowens, LCSWA Phone Number: 05/20/2023, 8:51 AM   Clinical Narrative: Transition of Care Department [facility]) has reviewed patient and no TOC needs have been identified at this time. We will continue to monitor patient advancement through interdisciplinary progression rounds. If new patient transition needs arise, please place a TOC consult.   Transition of Care Asessment: Insurance and Status: Insurance coverage has been reviewed Patient has primary care physician: Yes Home environment has been reviewed: Single Family Home Prior level of function:: Independent Prior/Current Home Services: No current home services Social Drivers of Health Review: SDOH reviewed no interventions necessary Readmission risk has been reviewed: Yes Transition of care needs: no transition of care needs at this time

## 2023-05-20 NOTE — H&P (Addendum)
[place]   PATIENT NAME: Eddie Marshall    MR#:  [id number]  DATE OF BIRTH:  1980-04-29  DATE OF ADMISSION:  05/19/2023  PRIMARY CARE PHYSICIAN: Billie Lade, MD   Patient is coming from: Home  REQUESTING/REFERRING PHYSICIAN: Bethann Berkshire, MD  CHIEF COMPLAINT:   Chief Complaint  Patient presents with   Atrial Fibrillation    HISTORY OF PRESENT ILLNESS:  Eddie Marshall is a 43 y.o. male with medical history significant for essential hypertension, seizure disorder and osteoarthritis, who presented to the emergency room with acute onset of mild dysuria with restricted urine outflow, without abdominal pain or fever or chills.  He denied any nausea or vomiting.  No hematuria or flank pain.  No chest pain or palpitations.  No cough or wheezing or dyspnea.  No headache or dizziness or blurred vision.  No other bleeding diathesis.  ED Course: When came to the ER, heart rate was 101 and later 106 with respiratory rate 22 and later 30 with otherwise normal vital signs..  Labs revealed hypokalemia of 3.1 and a CO2 of 20 with a BUN of 32 and creatinine 0.93.  CBC with within normal.  UA was positive for UTI with more than 50 RBCs and more than 50 WBCs and moderate leukocytes.  Urine culture was sent. EKG as reviewed by me : initial EKG showed atrial fibrillation with rapid ventricular sponsor of 102 with premature ventricular complexes and T wave inversion laterally.  Repeat EKG showed rate going up to 130. Imaging: Chest x-ray showed no acute cardiopulmonary disease.  The patient was given 2 g of IV Rocephin, 50M EQ p.o and 10 mEq IV. potassium chloride, IV Cardizem 10 mg bolus followed by drip and 1 L bolus of IV normal saline.  He will be admitted to a stepdown unit bed for further evaluation and management. PAST MEDICAL HISTORY:   Past Medical History:  Diagnosis Date   Arthritis    Complication of anesthesia    Pt had an epidural that went into his lungs   Hip disease     Hypertension    Seizures (HCC)     PAST SURGICAL HISTORY:   Past Surgical History:  Procedure Laterality Date   CYSTOSCOPY WITH RETROGRADE URETHROGRAM N/A 02/06/2016   Procedure: CYSTOSCOPY WITH RETROGRADE URETHROGRAM;  Surgeon: Bjorn Pippin, MD;  Location: [facility];  Service: Urology;  Laterality: N/A;   CYSTOSCOPY WITH URETHRAL DILATATION N/A 02/06/2016   Procedure: CYSTOSCOPY WITH URETHRAL DILATATION;  Surgeon: Bjorn Pippin, MD;  Location: [facility];  Service: Urology;  Laterality: N/A;   HIP SURGERY Left 1993 1995 1996   x 3   KNEE SURGERY Right 1999    SOCIAL HISTORY:   Social History   Tobacco Use   Smoking status: Never   Smokeless tobacco: Never  Substance Use Topics   Alcohol use: No    FAMILY HISTORY:   Family History  Problem Relation Age of Onset   Diabetes Mother    Hypertension Mother    Hypertension Father     DRUG ALLERGIES:   Allergies  Allergen Reactions   Other Anaphylaxis    EPIDURAL ANESTHESIA      REVIEW OF SYSTEMS:   ROS As per history of present illness. All pertinent systems were reviewed above. Constitutional, HEENT, cardiovascular, respiratory, GI, GU, musculoskeletal, neuro, psychiatric, endocrine, integumentary and hematologic systems were reviewed and are otherwise negative/unremarkable except for positive findings mentioned above in the HPI.   MEDICATIONS AT  HOME:   Prior to Admission medications   Medication Sig Start Date End Date Taking? Authorizing Provider  albuterol (VENTOLIN HFA) 108 (90 Base) MCG/ACT inhaler Inhale 2 puffs into the lungs every 6 (six) hours as needed for wheezing or shortness of breath. 01/26/23   Billie Lade, MD  lidocaine (LIDODERM) 5 % Place 1 patch onto the skin daily. Remove & Discard patch within 12 hours or as directed by MD    [provider]  methocarbamol (ROBAXIN) 500 MG tablet Take 1 tablet (500 mg total) by mouth every 6 (six) hours as needed for muscle spasms. 12/13/22   Billie Lade, MD  olmesartan (BENICAR) 40 MG tablet Take 1 tablet (40 mg total) by mouth daily. 02/09/23   Billie Lade, MD  Semaglutide-Weight Management 2.4 MG/0.75ML SOAJ Inject 2.4 mg into the skin once a week. 05/02/23   Billie Lade, MD      VITAL SIGNS:  Blood pressure (!) 118/90, pulse 77, temperature 98 F (36.7 C), temperature source Oral, resp. rate (!) 21, height 5' 7.5" (1.715 m), weight 118.8 kg, SpO2 98%.  PHYSICAL EXAMINATION:  Physical Exam  GENERAL:  43 y.o.-year-old Caucasian male patient lying in the bed with no acute distress.  EYES: Pupils equal, round, reactive to light and accommodation. No scleral icterus. Extraocular muscles intact.  HEENT: Head atraumatic, normocephalic. Oropharynx and nasopharynx clear.  NECK:  Supple, no jugular venous distention. No thyroid enlargement, no tenderness.  LUNGS: Normal breath sounds bilaterally, no wheezing, rales,rhonchi or crepitation. No use of accessory muscles of respiration.  CARDIOVASCULAR: Irregularly irregular tachycardic rhythm, S1, S2 normal. No murmurs, rubs, or gallops.  ABDOMEN: Soft, nondistended, nontender. Bowel sounds present. No organomegaly or mass.  EXTREMITIES: No pedal edema, cyanosis, or clubbing.  NEUROLOGIC: Cranial nerves II through XII are intact. Muscle strength 5/5 in all extremities. Sensation intact. Gait not checked.  PSYCHIATRIC: The patient is alert and oriented x 3.  Normal affect and good eye contact. SKIN: No obvious rash, lesion, or ulcer.   LABORATORY PANEL:   CBC Recent Labs  Lab 05/19/23 1737  WBC 7.9  HGB 15.7  HCT 47.2  PLT 220   ------------------------------------------------------------------------------------------------------------------  Chemistries  Recent Labs  Lab 05/19/23 1737  NA 142  K 3.1*  CL 107  CO2 20*  GLUCOSE 90  BUN 32*  CREATININE 0.93  CALCIUM 9.2  MG 1.9    ------------------------------------------------------------------------------------------------------------------  Cardiac Enzymes No results for input(s): "TROPONINI" in the last 168 hours. ------------------------------------------------------------------------------------------------------------------  RADIOLOGY:  DG Chest Port 1 View Result Date: 05/19/2023 CLINICAL DATA:  Weakness. EXAM: PORTABLE CHEST 1 VIEW COMPARISON:  None Available. FINDINGS: Upper normal heart size likely accentuated by portable AP technique.The cardiomediastinal contours are normal. The lungs are clear. Pulmonary vasculature is normal. No consolidation, pleural effusion, or pneumothorax. No acute osseous abnormalities are seen. IMPRESSION: No active disease. Electronically Signed   By: Narda Rutherford M.D.   On: 05/19/2023 21:28      IMPRESSION AND PLAN:  Assessment and Plan: * Sepsis due to gram-negative UTI [facility]) - The patient will be admitted to a stepdown unit bed. - We will continue Bactrim.  IV Rocephin. - We will follow urine culture and sensitivity. - The patient will be hydrated with IV normal saline. - Sepsis is manifested by tachycardia and tachypnea.  Paroxysmal atrial fibrillation with RVR (HCC) - The patient is admitted to stepdown unit bed for further monitoring. - We will continue IV Cardizem drip. - We  will obtain a 2D echo and cardiology consult can be called in AM. - The patient's CHA2DS2-VASc score at this time is 1.  Acute urinary retention - This could be related to BPH. - The patient had several attempts at insertion of Foley an coud catheter without success. - He initially had 330 mL and later 660 mL per bladder scan. - I discussed the case with Dr. Sande Brothers who recommended pain medications to help with Foley catheter insertion specially with associated hematuria. - The patient was given 1 mg of IV Dilaudid and Ativan and he was more comfortable. - Dr. Sande Brothers recommended  awaiting Dr. Ronne Binning to insert it in a.m. if the patient is more comfortable. - A consult was placed to Dr. Ronne Binning and he can be called in AM.  Hypokalemia - Potassium will be replaced and magnesium level will be checked.  Essential hypertension - We will continue antihypertensive therapy.   DVT prophylaxis: Lovenox.  Advanced Care Planning:  Code Status: full code.  Family Communication:  The plan of care was discussed in details with the patient (and family). I answered all questions. The patient agreed to proceed with the above mentioned plan. Further management will depend upon hospital course. Disposition Plan: Back to previous home environment Consults called: He will need urology unless a catheter is inserted and cardiology All the records are reviewed and case discussed with ED provider.  Status is: Inpatient    At the time of the admission, it appears that the appropriate admission status for this patient is inpatient.  This is judged to be reasonable and necessary in order to provide the required intensity of service to ensure the patient's safety given the presenting symptoms, physical exam findings and initial radiographic and laboratory data in the context of comorbid conditions.  The patient requires inpatient status due to high intensity of service, high risk of further deterioration and high frequency of surveillance required.  I certify that at the time of admission, it is my clinical judgment that the patient will require inpatient hospital care extending more than 2 midnights.                            Dispo: The patient is from: Home              Anticipated d/c is to: Home              Patient currently is not medically stable to d/c.              Difficult to place patient: No  Hannah Beat M.D on 05/20/2023 at 4:13 AM  [facility]   From 7 PM-7 AM, contact night-coverage [email or web address]  CC: Primary care physician; Billie Lade, MD

## 2023-05-20 NOTE — Progress Notes (Signed)
Unsuccessful attempt at coude insertion by Dr Judd Lien. Kellogg RN

## 2023-05-21 DIAGNOSIS — A4150 Gram-negative sepsis, unspecified: Secondary | ICD-10-CM | POA: Diagnosis not present

## 2023-05-21 DIAGNOSIS — N390 Urinary tract infection, site not specified: Secondary | ICD-10-CM | POA: Diagnosis not present

## 2023-05-21 LAB — BASIC METABOLIC PANEL
Anion gap: 6 (ref 5–15)
BUN: 19 mg/dL (ref 6–20)
CO2: 23 mmol/L (ref 22–32)
Calcium: 7.9 mg/dL — ABNORMAL LOW (ref 8.9–10.3)
Chloride: 107 mmol/L (ref 98–111)
Creatinine, Ser: 0.67 mg/dL (ref 0.61–1.24)
GFR, Estimated: >60 mL/min (ref >60)
Glucose, Bld: 92 mg/dL (ref 70–99)
Potassium: 3.5 mmol/L (ref 3.5–5.1)
Sodium: 136 mmol/L (ref 135–145)

## 2023-05-21 LAB — MAGNESIUM: Magnesium: 2.1 mg/dL (ref 1.7–2.4)

## 2023-05-21 LAB — URINE CULTURE: Culture: NO GROWTH

## 2023-05-21 LAB — HIV ANTIBODY (ROUTINE TESTING W REFLEX): HIV Screen 4th Generation wRfx: NONREACTIVE

## 2023-05-21 MED ORDER — CHLORHEXIDINE GLUCONATE CLOTH 2 % EX PADS
6.0000 | MEDICATED_PAD | Freq: Every day | CUTANEOUS | Status: DC
  Administered 2023-05-21 – 2023-05-23 (×3): 6 via TOPICAL

## 2023-05-21 MED ORDER — COSYNTROPIN 0.25 MG IJ SOLR
0.2500 mg | Freq: Once | INTRAMUSCULAR | Status: AC
  Administered 2023-05-22: 0.25 mg via INTRAVENOUS
  Filled 2023-05-21 (×2): qty 0.25

## 2023-05-21 NOTE — Progress Notes (Addendum)
Progress Note   Patient: Eddie Marshall ZOX:[id number] DOB: Feb 26, 1981 DOA: 05/19/2023     2 DOS: the patient was seen and examined on 05/21/2023   Brief hospital admission course: As per H&P written by Dr. Jarome Lamas on 05/19/2023 Terre Zabriskie is a 43 y.o. male with medical history significant for essential hypertension, seizure disorder and osteoarthritis, who presented to the emergency room with acute onset of mild dysuria with restricted urine outflow, without abdominal pain or fever or chills.  He denied any nausea or vomiting.  No hematuria or flank pain.  No chest pain or palpitations.  No cough or wheezing or dyspnea.  No headache or dizziness or blurred vision.  No other bleeding diathesis.   ED Course: When came to the ER, heart rate was 101 and later 106 with respiratory rate 22 and later 30 with otherwise normal vital signs..  Labs revealed hypokalemia of 3.1 and a CO2 of 20 with a BUN of 32 and creatinine 0.93.  CBC with within normal.  UA was positive for UTI with more than 50 RBCs and more than 50 WBCs and moderate leukocytes.  Urine culture was sent. EKG as reviewed by me : initial EKG showed atrial fibrillation with rapid ventricular sponsor of 102 with premature ventricular complexes and T wave inversion laterally.  Repeat EKG showed rate going up to 130. Imaging: Chest x-ray showed no acute cardiopulmonary disease.   The patient was given 2 g of IV Rocephin, 30M EQ p.o and 10 mEq IV. potassium chloride, IV Cardizem 10 mg bolus followed by drip and 1 L bolus of IV normal saline.  He will be admitted to a stepdown unit bed for further evaluation and management.  Assessment and Plan: 1) Sepsis due to urinary source--POA -Continue to cleanse IV Rocephin daily pending urine culture from 05/19/2023 -Continue as needed antipyretics and antiemetics  2)Paroxysmal atrial fibrillation with RVR (HCC) - -Patient is back in sinus rhythm -Cardizem drip has been discontinued;  -CHA2DS2-VASc score 1; no  need for long-term anticoagulation. -Echo with EF of 55 to 60%, no mitral stenosis, no aortic stenosis, No significant atrial dilatation - Patient reports no chest pain no palpitations, no dizziness, no dyspnea  3)Acute urinary retention/urethral traumatic injury - This could be related to BPH and/or acute prostatitis. -Continue current antibiotics pending urine culture data -s/p Video-assisted cystoscopy with placement of Foley catheter by urology-on 05/20/2023 -Chlamydia gonorrhea urine test pending -Continue as needed analgesics -Follow hemoglobin trend.  4)Hypokalemia/hypomagnesemia --Normalized after replacement  5)Essential hypertension - Continue Avapro -Heart healthy/low-sodium diet discussed with patient -Maintain adequate hydration -Follow vital signs.  6) low a.m. cortisol levels at 6.6-----ACTH test requested in am   Subjective:  No chest pain, no  palpitation -No dizziness No fever  Or chills  No Nausea, Vomiting or Diarrhea  Physical Exam: Vitals:   05/20/23 2018 05/20/23 2022 05/21/23 0030 05/21/23 0526  BP: (!) 95/59 102/65 102/65 100/65  Pulse:   68 77  Resp: 18  18 16   Temp: 99 F (37.2 C)  97.6 F (36.4 C) 97.8 F (36.6 C)  TempSrc: Oral  Oral Oral  SpO2: 94%  97% 96%  Weight:      Height:        Physical Exam  Gen:- Awake Alert, in no acute distress  HEENT:- [place].AT, No sclera icterus Neck-Supple Neck,No JVD,.  Lungs-  CTAB , fair air movement bilaterally  CV- S1, S2 normal, RRR Abd-  +ve B.Sounds, Abd Soft, No tenderness, no CVA area  tenderness Extremity/Skin:- No  edema,   good pedal pulses  Psych-affect is appropriate, oriented x3 Neuro-no new focal deficits, no tremors GU-Foley without further hematuria--placed by urologist Dr. Ronne Binning on 05/20/2023  Family Communication: No family at bedside.  Disposition: home in 1 to 2 days with outpatient neurology follow-up--awaiting culture data Status is: Inpatient   Planned Discharge  Destination: Home  Author: Shon Hale, MD 05/21/2023 12:40 PM  For on call review [email or web address].

## 2023-05-21 NOTE — Discharge Instructions (Signed)
low a.m. cortisol levels at 6.6-----ACTH test requested in am   Please follow up with Endocrinologist Dr. Purcell Nails, MD, Endocrinology, Diabetes & Metabolism-- [facility], [address] [place], [place] [address], Phone Number- tel:[phone]

## 2023-05-22 DIAGNOSIS — R338 Other retention of urine: Secondary | ICD-10-CM | POA: Diagnosis not present

## 2023-05-22 DIAGNOSIS — E876 Hypokalemia: Secondary | ICD-10-CM | POA: Diagnosis not present

## 2023-05-22 DIAGNOSIS — I480 Paroxysmal atrial fibrillation: Secondary | ICD-10-CM | POA: Diagnosis not present

## 2023-05-22 DIAGNOSIS — A4150 Gram-negative sepsis, unspecified: Secondary | ICD-10-CM | POA: Diagnosis not present

## 2023-05-22 LAB — ACTH STIMULATION, 3 TIME POINTS
Cortisol, 30 Min: 19.5 ug/dL
Cortisol, 60 Min: 24.8 ug/dL
Cortisol, Base: 9.8 ug/dL

## 2023-05-22 MED ORDER — CEFADROXIL 500 MG/5ML PO SUSR
500.0000 mg | Freq: Two times a day (BID) | ORAL | Status: AC
  Filled 2023-05-22 (×6): qty 5

## 2023-05-22 NOTE — Plan of Care (Signed)
   Problem: Safety: Goal: Ability to remain free from injury will improve Outcome: Progressing

## 2023-05-22 NOTE — Progress Notes (Signed)
Progress Note   Patient: Eddie Marshall GLO:[id number] DOB: June 06, 1980 DOA: 05/19/2023     3 DOS: the patient was seen and examined on 05/22/2023   Brief hospital admission course: As per H&P written by Dr. Jarome Lamas on 05/19/2023 [name] is a 43 y.o. male with medical history significant for essential hypertension, seizure disorder and osteoarthritis, who presented to the emergency room with acute onset of mild dysuria with restricted urine outflow, without abdominal pain or fever or chills.  He denied any nausea or vomiting.  No hematuria or flank pain.  No chest pain or palpitations.  No cough or wheezing or dyspnea.  No headache or dizziness or blurred vision.  No other bleeding diathesis.   ED Course: When came to the ER, heart rate was 101 and later 106 with respiratory rate 22 and later 30 with otherwise normal vital signs..  Labs revealed hypokalemia of 3.1 and a CO2 of 20 with a BUN of 32 and creatinine 0.93.  CBC with within normal.  UA was positive for UTI with more than 50 RBCs and more than 50 WBCs and moderate leukocytes.  Urine culture was sent. EKG as reviewed by me : initial EKG showed atrial fibrillation with rapid ventricular sponsor of 102 with premature ventricular complexes and T wave inversion laterally.  Repeat EKG showed rate going up to 130. Imaging: Chest x-ray showed no acute cardiopulmonary disease.   The patient was given 2 g of IV Rocephin, 56M EQ p.o and 10 mEq IV. potassium chloride, IV Cardizem 10 mg bolus followed by drip and 1 L bolus of IV normal saline.  He will be admitted to a stepdown unit bed for further evaluation and management.  Assessment and Plan: 1) Sepsis due to urinary source--POA -Sepsis features resolved -Continue as needed supportive care and fluid resuscitation -IV antibiotics will be transitioned to oral route.  2)Paroxysmal atrial fibrillation with RVR (HCC) - -Patient is back in sinus rhythm -Cardizem drip has been discontinued;   -CHA2DS2-VASc score 1; no need for long-term anticoagulation. -Echo with EF of 55 to 60%, no mitral stenosis, no aortic stenosis, No significant atrial dilatation - Patient reports no chest pain no palpitations, no dizziness, no dyspnea  3)Acute urinary retention/urethral traumatic injury - This could be related to BPH and/or acute prostatitis. -Patient urine culture without any particular growth; overall infection/sepsis features resolved.  Antibiotics will be transitioned to oral cefadroxil. -s/p Video-assisted cystoscopy with placement of Foley catheter by urology-on 05/20/2023 -Chlamydia gonorrhea urine test pending -Continue as needed analgesics -Continue to follow hemoglobin trend.  4)Hypokalemia/hypomagnesemia --Normalized after replacement  5)Essential hypertension - Continue Avapro -Heart healthy/low-sodium diet discussed with patient -Maintain adequate hydration -Follow vital signs.  6) low a.m. cortisol levels at 6.6-- -most likely acute phase reactant -ACTH test has been ordered and will follow final results; if needed outpatient follow-up with endocrinology service can be arranged.  Subjective:  No fever, no chest pain, no palpitations, no nausea or vomiting. -Patient without any further hematuria or frank bleeding.  Physical Exam: Vitals:   05/22/23 0000 05/22/23 0100 05/22/23 0800 05/22/23 1321  BP: 130/71   (!) 144/81  Pulse: 85   78  Resp: (!) 23 20 (!) 23 16  Temp: 98.2 F (36.8 C)   98.1 F (36.7 C)  TempSrc:      SpO2: 94%   99%  Weight:      Height:        Physical Exam General exam: Alert, awake, oriented x 3; febrile,  no chest pain, in no acute distress. Respiratory system: Clear to auscultation. Respiratory effort normal.  No using accessory muscle.  Good saturation on room air. Cardiovascular system:RRR. No murmurs, rubs, no gallops, no JVD. Gastrointestinal system: Abdomen is obese, nondistended, soft and nontender. No organomegaly or masses  felt. Normal bowel sounds heard. Central nervous system: No focal neurological deficits. Extremities: No cyanosis or clubbing. Skin: No rashes, no petechiae; Foley catheter in place. Psychiatry: Flat affect appreciated.  Family Communication: No family at bedside.  Disposition: home in 1 to 2 days with outpatient neurology follow-up--awaiting culture data Status is: Inpatient   Planned Discharge Destination: Home  Author: Vassie Loll, MD 05/22/2023 4:28 PM  For on call review [email or web address].

## 2023-05-23 DIAGNOSIS — N35011 Post-traumatic bulbous urethral stricture: Secondary | ICD-10-CM

## 2023-05-23 DIAGNOSIS — I10 Essential (primary) hypertension: Secondary | ICD-10-CM | POA: Diagnosis not present

## 2023-05-23 DIAGNOSIS — N3001 Acute cystitis with hematuria: Secondary | ICD-10-CM

## 2023-05-23 DIAGNOSIS — R339 Retention of urine, unspecified: Secondary | ICD-10-CM

## 2023-05-23 DIAGNOSIS — I480 Paroxysmal atrial fibrillation: Secondary | ICD-10-CM | POA: Diagnosis not present

## 2023-05-23 LAB — GC/CHLAMYDIA PROBE AMP (~~LOC~~) NOT AT ARMC
Chlamydia: NEGATIVE
Comment: NEGATIVE
Comment: NORMAL
Neisseria Gonorrhea: NEGATIVE

## 2023-05-23 MED ORDER — CEFADROXIL 500 MG PO CAPS: 500.0000 mg | ORAL_CAPSULE | Freq: Two times a day (BID) | ORAL | 0 refills | Status: AC

## 2023-05-23 MED ORDER — ACETAMINOPHEN 325 MG PO TABS: 650.0000 mg | ORAL_TABLET | Freq: Four times a day (QID) | ORAL | Status: AC | PRN

## 2023-05-23 MED ORDER — CEFADROXIL 500 MG PO CAPS
500.0000 mg | ORAL_CAPSULE | Freq: Two times a day (BID) | ORAL | Status: DC
  Filled 2023-05-23 (×4): qty 1

## 2023-05-23 NOTE — Consult Note (Signed)
Urology Consult  Referring physician: Dr. Gwenlyn Perking Reason for referral: difficult foley  Chief Complaint: urinary retention  History of Present Illness: Eddie Marshall is a 43yo with a history of urethral stricture disease who was admitted with UTI and concern for sepsis. He has a history of urethral stricture last dilated in 2017 with Dr. Annabell Howells at [facility]. For the past several months he has noted a weaker urinary stream, straining to urinate, and a feeling of incomplete emptying. Nursing had placed a foley and since foley catheter patient has had frank blood around the foley and in the foley bag. Bladder scan showed greater that 1000cc.   Past Medical History:  Diagnosis Date   Arthritis    Complication of anesthesia    Pt had an epidural that went into his lungs   Hip disease    Hypertension    Seizures (HCC)    Past Surgical History:  Procedure Laterality Date   CYSTOSCOPY WITH RETROGRADE URETHROGRAM N/A 02/06/2016   Procedure: CYSTOSCOPY WITH RETROGRADE URETHROGRAM;  Surgeon: Bjorn Pippin, MD;  Location: [facility];  Service: Urology;  Laterality: N/A;   CYSTOSCOPY WITH URETHRAL DILATATION N/A 02/06/2016   Procedure: CYSTOSCOPY WITH URETHRAL DILATATION;  Surgeon: Bjorn Pippin, MD;  Location: [facility];  Service: Urology;  Laterality: N/A;   HIP SURGERY Left 1993 1995 1996   x 3   KNEE SURGERY Right 1999    Medications: I have reviewed the patient's current medications. Allergies:  Allergies  Allergen Reactions   Other Anaphylaxis    Epidural anesthesia    Family History  Problem Relation Age of Onset   Diabetes Mother    Hypertension Mother    Hypertension Father    Social History:  reports that he has never smoked. He has never used smokeless tobacco. He reports that he does not drink alcohol and does not use drugs.  Review of Systems  Genitourinary:  Positive for difficulty urinating, dysuria and urgency.  All other systems reviewed and are negative.   Physical Exam:   Vital signs in last 24 hours: Temp:  [98.1 F (36.7 C)-99.1 F (37.3 C)] 98.4 F (36.9 C) (02/03 0338) Pulse Rate:  [78-83] 83 (02/03 0338) Resp:  [16] 16 (02/02 1321) BP: (144-152)/(81-99) 150/99 (02/03 0338) SpO2:  [93 %-99 %] 93 % (02/03 1610) Physical Exam Vitals reviewed.  Constitutional:      Appearance: Normal appearance.  HENT:     Head: Normocephalic and atraumatic.     Mouth/Throat:     Mouth: Mucous membranes are dry.  Eyes:     Extraocular Movements: Extraocular movements intact.     Pupils: Pupils are equal, round, and reactive to light.  Cardiovascular:     Rate and Rhythm: Normal rate and regular rhythm.  Pulmonary:     Effort: Pulmonary effort is normal. No respiratory distress.  Abdominal:     General: Abdomen is flat. There is distension.  Genitourinary:    Penis: Normal.      Testes: Normal.  Musculoskeletal:        General: No swelling. Normal range of motion.     Cervical back: Normal range of motion and neck supple.  Skin:    General: Skin is warm and dry.  Neurological:     General: No focal deficit present.     Mental Status: He is alert and oriented to person, place, and time.  Psychiatric:        Mood and Affect: Mood normal.  Behavior: Behavior normal.        Thought Content: Thought content normal.        Judgment: Judgment normal.     Laboratory Data:  Results for orders placed or performed during the hospital encounter of 05/19/23 (from the past 72 hours)  HIV Antibody (routine testing w rflx)     Status: None   Collection Time: 05/21/23  5:05 AM  Result Value Ref Range   HIV Screen 4th Generation wRfx Non Reactive Non Reactive    Comment: Performed at [facility], [address] [place], [place] [address]  Basic metabolic panel     Status: Abnormal   Collection Time: 05/21/23  5:05 AM  Result Value Ref Range   Sodium 136 135 - 145 mmol/L   Potassium 3.5 3.5 - 5.1 mmol/L   Chloride 107 98 - 111 mmol/L   CO2 23 22  - 32 mmol/L   Glucose, Bld 92 70 - 99 mg/dL    Comment: Glucose reference range applies only to samples taken after fasting for at least 8 hours.   BUN 19 6 - 20 mg/dL   Creatinine, Ser 2.13 0.61 - 1.24 mg/dL   Calcium 7.9 (L) 8.9 - 10.3 mg/dL   GFR, Estimated >08 >65 mL/min    Comment: (NOTE) Calculated using the CKD-EPI Creatinine Equation (2021)    Anion gap 6 5 - 15    Comment: Performed at [facility], [address], [place], [place] [address]  Magnesium     Status: None   Collection Time: 05/21/23  5:05 AM  Result Value Ref Range   Magnesium 2.1 1.7 - 2.4 mg/dL    Comment: Performed at [facility], [address], [place], [place] [address]  ACTH stimulation, 3 time points (baseline, 30 min, 60 min)     Status: None   Collection Time: 05/22/23  5:37 AM  Result Value Ref Range   Cortisol, Base 9.8 ug/dL    Comment: NO NORMAL RANGE ESTABLISHED FOR THIS TEST   Cortisol, 30 Min 19.5 ug/dL   Cortisol, 60 Min 84.1 ug/dL    Comment: Performed at [facility], [address] [place], [place] [address]   Recent Results (from the past 240 hours)  Urine Culture     Status: None   Collection Time: 05/19/23  8:01 PM   Specimen: Urine, Clean Catch  Result Value Ref Range Status   Specimen Description   Final    URINE, CLEAN CATCH Performed at [facility], [address] [place], [place] [address]    Special Requests   Final    NONE Performed at [facility], [address], [place], [place] [address]    Culture   Final    NO GROWTH Performed at [facility], [address], [place], [place] [address]    Report Status 05/21/2023 FINAL  Final  MRSA Next Gen by PCR, Nasal     Status: None   Collection Time: 05/20/23  1:26 AM   Specimen: Nasal Mucosa; Nasal Swab  Result Value Ref Range Status   MRSA by PCR Next Gen NOT DETECTED NOT DETECTED Final    Comment: (NOTE) The GeneXpert MRSA Assay (FDA approved for NASAL specimens only), is one component of a  comprehensive MRSA colonization surveillance program. It is not intended to diagnose MRSA infection nor to guide or monitor treatment for MRSA infections. Test performance is not FDA approved in patients less than 19 years old. Performed  at [facility], [address] [place], [place] [address]    Creatinine: Recent Labs    05/19/23 1737 05/20/23 0546 05/21/23 0505  CREATININE 0.93 0.72 0.67   Baseline Creatinine: 0.6  Pre-procedure diagnosis: urethral stricture  Post-procedure diagnosis: Same  Procedure: 1. Cystoscopy 2. Urethral balloon dilation  Attending: Wilkie Aye  Estimated blood loss: Minimal  Specimens: none  Drains: 16 french council catheter  Findings: dense bulbar urethral stricture.  No masses/lesions in the bladder.  Indications: Patient is a 43 year old male with a history of difficulty with urination and was found to have a urethral stricture. After discussing treatment options the patient wishes to proceed with urethral dilation and foley placement.  Procedure in detail:  His genitalia was then prepped and draped in usual sterile fashion. We then passed a 17 French cystoscope up to the bulbar urethra where we encountered a dense stricture. A zipwire was passed into bladder. The cystoscope was removed and we then inserted the balloon dilator across the stricture. We then inflated the balloon to 16cm of water and held the pressure for 3 minutes. The balloon was then deflated. We then passed the cystoscope into the bladder. We inspected the bladder and no lesions, masses, or calculi were noted. We then removed the cystoscope and placed an 16 [place] council catheter over the zipwrie. The wire was then removed.  This then concluded the procedure which was well tolerated by the patient.  Complications: None   Impression/Assessment:  43yo with urethral stricture  Plan:  Urethral stricture: The patient required dilation and 16 french foley placement due to  a dense bulbar urethral stricture. The foley showed remain in place for 1 week prior to attempting a voiding trial.   Wilkie Aye 05/23/2023, 8:18 AM

## 2023-05-23 NOTE — Discharge Summary (Signed)
Physician Discharge Summary   Patient: Eddie Marshall MRN: [id number] DOB: [birth year]  Admit date:     05/19/2023  Discharge date: 05/23/23  Discharge Physician: Vassie Loll   PCP: Billie Lade, MD   Recommendations at discharge:  Repeat basic metabolic panel to follow ultralights renal function Repeat CBC to follow hemoglobin trend/stability Continue assisting patient with weight loss management. -Make sure patient follow-up with urology service as instructed Reassess blood pressure and adjust antihypertensive regimen as required. Follow-up final pending results of chlamydia/gonorrhea urine probe.  Discharge Diagnoses: Principal Problem:   Sepsis due to gram-negative UTI [facility]) Active Problems:   Paroxysmal atrial fibrillation with RVR (HCC)   Urinary retention   Hypokalemia   Essential hypertension   Post-traumatic bulbous urethral stricture   Acute cystitis with hematuria Class III obesity.  Brief hospital admission course: As per H&P written by Dr. Jarome Lamas on 05/19/2023 Eddie Marshall is a 43 y.o. male with medical history significant for essential hypertension, seizure disorder and osteoarthritis, who presented to the emergency room with acute onset of mild dysuria with restricted urine outflow, without abdominal pain or fever or chills.  He denied any nausea or vomiting.  No hematuria or flank pain.  No chest pain or palpitations.  No cough or wheezing or dyspnea.  No headache or dizziness or blurred vision.  No other bleeding diathesis.   ED Course: When came to the ER, heart rate was 101 and later 106 with respiratory rate 22 and later 30 with otherwise normal vital signs..  Labs revealed hypokalemia of 3.1 and a CO2 of 20 with a BUN of 32 and creatinine 0.93.  CBC with within normal.  UA was positive for UTI with more than 50 RBCs and more than 50 WBCs and moderate leukocytes.  Urine culture was sent. EKG as reviewed by me : initial EKG showed atrial fibrillation with rapid  ventricular sponsor of 102 with premature ventricular complexes and T wave inversion laterally.  Repeat EKG showed rate going up to 130. Imaging: Chest x-ray showed no acute cardiopulmonary disease.   The patient was given 2 g of IV Rocephin, 29M EQ p.o and 10 mEq IV. potassium chloride, IV Cardizem 10 mg bolus followed by drip and 1 L bolus of IV normal saline.  He will be admitted to a stepdown unit bed for further evaluation and management.    Assessment and Plan: 1) Sepsis due to urinary source--POA -Sepsis features resolved -Continue as needed supportive care and fluid resuscitation -antibiotics transitioned to oral route with intentions to treat for 1 more week as per urology rec's.   2)Paroxysmal atrial fibrillation with RVR (HCC) - -Patient is back in sinus rhythm -Cardizem drip has been discontinued;  -CHA2DS2-VASc score 1; no need for long-term anticoagulation. -Echo with EF of 55 to 60%, no mitral stenosis, no aortic stenosis, No significant atrial dilatation - Patient reports no chest pain no palpitations, no dizziness, no dyspnea -resume daily baby aspirin.   3)Acute urinary retention/urethral traumatic injury - This could be related to BPH and/or acute prostatitis. -Patient urine culture without any particular growth; overall infection/sepsis features resolved.  Antibiotics will be transitioned to oral cefadroxil. -s/p Video-assisted cystoscopy with placement of Foley catheter by urology-on 05/20/2023 -Chlamydia gonorrhea urine test pending at discharge -Continue as needed analgesics -Continue to follow hemoglobin trend intermittently . -Patient discharge home with Foley catheter in place and follow-up on 05/30/2023 with urology service.   4)Hypokalemia/hypomagnesemia --Normalized after replacement -Continue to follow ultralights trend.   5)Essential  hypertension - Continue to follow vital signs -Blood pressure stable at discharge -Continue home use of Benicar -Heart  healthy/low-sodium   6) low a.m. cortisol levels at 6.6-- -most likely acute phase reactant -ACTH stimulation test within normal limits. -No need for outpatient follow-up with endocrinology service.  7) class III obesity Body mass index is 40.43 kg/m.  -Low-calorie diet, portion control and increase physical activity discussed with patient.  Consultants: Urology service Procedures performed: See below for x-ray reports. Disposition: Home Diet recommendation: Heart healthy/low calorie diet  DISCHARGE MEDICATION: Allergies as of 05/23/2023       Reactions   Other Anaphylaxis   Epidural anesthesia        Medication List     TAKE these medications    acetaminophen 325 MG tablet Commonly known as: TYLENOL Take 2 tablets (650 mg total) by mouth every 6 (six) hours as needed for mild pain (pain score 1-3) (or Fever >/= 101).   albuterol 108 (90 Base) MCG/ACT inhaler Commonly known as: VENTOLIN HFA Inhale 2 puffs into the lungs every 6 (six) hours as needed for wheezing or shortness of breath.   aspirin EC 81 MG tablet Take 81 mg by mouth daily.   cefadroxil 500 MG capsule Commonly known as: DURICEF Take 1 capsule (500 mg total) by mouth 2 (two) times daily for 7 days.   methocarbamol 500 MG tablet Commonly known as: ROBAXIN Take 1 tablet (500 mg total) by mouth every 6 (six) hours as needed for muscle spasms.   olmesartan 40 MG tablet Commonly known as: BENICAR Take 1 tablet (40 mg total) by mouth daily.   Wegovy 2.4 MG/0.75ML Soaj Generic drug: Semaglutide-Weight Management Inject 2.4 mg into the skin once a week. What changed: when to take this        Follow-up Information     Billie Lade, MD. Schedule an appointment as soon as possible for a visit in 2 week(s).   Specialty: Internal Medicine Contact information: [address] [place] [address] [phone]         Malen Gauze, MD. Go in 1 week(s).   Specialty:  Urology Contact information: [address]  [place] [address] [phone]                Discharge Exam: Ceasar Mons Weights   05/19/23 1645  Weight: 118.8 kg   General exam: Alert, awake, oriented x 3; febrile, no chest pain, in no acute distress. Respiratory system: Clear to auscultation. Respiratory effort normal.  No using accessory muscle.  Good saturation on room air. Cardiovascular system:RRR. No murmurs, rubs, no gallops, no JVD. Gastrointestinal system: Abdomen is obese, nondistended, soft and nontender. No organomegaly or masses felt. Normal bowel sounds heard. Central nervous system: No focal neurological deficits. Extremities: No cyanosis or clubbing. Skin: No rashes, no petechiae; Foley catheter in place. Psychiatry: Flat affect appreciated.  Condition at discharge: Stable and improved.  The results of significant diagnostics from this hospitalization (including imaging, microbiology, ancillary and laboratory) are listed below for reference.   Imaging Studies: ECHOCARDIOGRAM COMPLETE Result Date: 05/20/2023    ECHOCARDIOGRAM REPORT   Patient Name:   STRUMMER CANIPE Date of Exam: 05/20/2023 Medical Rec #:  [id number]   Height:       67.5 in Accession #:    [id number]  Weight:       262.0 lb Date of Birth:  1980/07/04    BSA:  2.281 m Patient Age:    43 years    BP:           107/65 mmHg Patient Gender: M           HR:           62 bpm. [facility] Procedure: 2D Echo, Cardiac Doppler and Color Doppler Indications:    Atrial fibrillation  History:        Patient has no prior history of Echocardiogram examinations.                 Signs/Symptoms:Dizziness/Lightheadedness; Risk                 Factors:Hypertension and Sleep Apnea.  Sonographer:    Vern Claude Referring Phys: [id number] JAN A MANSY IMPRESSIONS  1. Left ventricular ejection fraction, by estimation, is 55 to 60%. The left ventricle has normal function. Left ventricular endocardial  border not optimally defined to evaluate regional wall motion. Left ventricular diastolic parameters were normal.  2. Right ventricular systolic function is normal. The right ventricular size is normal. Tricuspid regurgitation signal is inadequate for assessing PA pressure.  3. The mitral valve is normal in structure. No evidence of mitral valve regurgitation. No evidence of mitral stenosis.  4. The aortic valve was not well visualized. Aortic valve regurgitation is not visualized. No aortic stenosis is present. Comparison(s): No prior Echocardiogram. FINDINGS  Left Ventricle: Left ventricular ejection fraction, by estimation, is 55 to 60%. The left ventricle has normal function. Left ventricular endocardial border not optimally defined to evaluate regional wall motion. The left ventricular internal cavity size was normal in size. There is no left ventricular hypertrophy. Left ventricular diastolic parameters were normal. Right Ventricle: The right ventricular size is normal. No increase in right ventricular wall thickness. Right ventricular systolic function is normal. Tricuspid regurgitation signal is inadequate for assessing PA pressure. Left Atrium: Left atrial size was normal in size. Right Atrium: Right atrial size was normal in size. Pericardium: There is no evidence of pericardial effusion. Mitral Valve: The mitral valve is normal in structure. No evidence of mitral valve regurgitation. No evidence of mitral valve stenosis. MV peak gradient, 1.4 mmHg. The mean mitral valve gradient is 1.0 mmHg. Tricuspid Valve: The tricuspid valve is normal in structure. Tricuspid valve regurgitation is not demonstrated. No evidence of tricuspid stenosis. Aortic Valve: The aortic valve was not well visualized. Aortic valve regurgitation is not visualized. No aortic stenosis is present. Aortic valve mean gradient measures 2.0 mmHg. Aortic valve peak gradient measures 4.4 mmHg. Aortic valve area, by VTI measures 3.73 cm.  Pulmonic Valve: The pulmonic valve was not well visualized. Pulmonic valve regurgitation is not visualized. No evidence of pulmonic stenosis. Aorta: The aortic root is normal in size and structure. Venous: The inferior vena cava was not well visualized. IAS/Shunts: The interatrial septum was not well visualized.  LEFT VENTRICLE PLAX 2D LVIDd:         4.80 cm      Diastology LVIDs:         3.00 cm      LV e' medial:    9.46 cm/s LV PW:         1.10 cm      LV E/e' medial:  4.6 LV IVS:        0.90 cm      LV e' lateral:   10.40 cm/s LVOT diam:     2.10 cm  LV E/e' lateral: 4.2 LV SV:         63 LV SV Index:   28 LVOT Area:     3.46 cm  LV Volumes (MOD) LV vol d, MOD A2C: 155.0 ml LV vol d, MOD A4C: 110.0 ml LV vol s, MOD A2C: 61.6 ml LV vol s, MOD A4C: 53.2 ml LV SV MOD A2C:     93.4 ml LV SV MOD A4C:     110.0 ml LV SV MOD BP:      76.6 ml RIGHT VENTRICLE RV Basal diam:  3.30 cm RV Mid diam:    3.20 cm RV S prime:     10.90 cm/s TAPSE (M-mode): 1.7 cm LEFT ATRIUM             Index        RIGHT ATRIUM          Index LA diam:        3.80 cm 1.67 cm/m   RA Area:     9.92 cm LA Vol (A2C):   43.2 ml 18.94 ml/m  RA Volume:   18.50 ml 8.11 ml/m LA Vol (A4C):   38.3 ml 16.79 ml/m LA Biplane Vol: 43.4 ml 19.03 ml/m  AORTIC VALVE                    PULMONIC VALVE AV Area (Vmax):    3.23 cm     PV Vmax:       0.65 m/s AV Area (Vmean):   3.35 cm     PV Peak grad:  1.7 mmHg AV Area (VTI):     3.73 cm AV Vmax:           105.00 cm/s AV Vmean:          65.600 cm/s AV VTI:            0.170 m AV Peak Grad:      4.4 mmHg AV Mean Grad:      2.0 mmHg LVOT Vmax:         97.90 cm/s LVOT Vmean:        63.500 cm/s LVOT VTI:          0.183 m LVOT/AV VTI ratio: 1.08  AORTA Ao Root diam: 3.70 cm MITRAL VALVE MV Area (PHT): 3.93 cm    SHUNTS MV Area VTI:   3.72 cm    Systemic VTI:  0.18 m MV Peak grad:  1.4 mmHg    Systemic Diam: 2.10 cm MV Mean grad:  1.0 mmHg MV Vmax:       0.58 m/s MV Vmean:      36.7 cm/s MV Decel Time: 193  msec MV E velocity: 43.70 cm/s MV A velocity: 43.30 cm/s MV E/A ratio:  1.01 Vishnu Priya Mallipeddi Electronically signed by Winfield Rast Mallipeddi Signature Date/Time: 05/20/2023/5:40:44 PM    Final    US RENAL Result Date: 05/20/2023 CLINICAL DATA:  UTI EXAM: RENAL / URINARY TRACT ULTRASOUND COMPLETE COMPARISON:  None Available. FINDINGS: Right Kidney: Renal measurements: 11.5 x 6.4 x 7.1 cm = volume: 277 mL. Echogenicity within normal limits. No mass or hydronephrosis visualized. Left Kidney: Renal measurements: 11.3 x 6.7 x 6.5 cm = volume: 260 mL. Echogenicity within normal limits. No mass or hydronephrosis visualized. Bladder: Dilated bladder.  Volume measured 993 cc. Other: None. IMPRESSION: No collecting system dilatation. The sonographer states that the patient did supposedly has of Foley catheter but no catheter balloon is seen in the  images of the bladder. Bladder is dilated. Please correlate clinical findings. Electronically Signed   By: Karen Kays M.D.   On: 05/20/2023 13:18   DG Chest Port 1 View Result Date: 05/19/2023 CLINICAL DATA:  Weakness. EXAM: PORTABLE CHEST 1 VIEW COMPARISON:  None Available. FINDINGS: Upper normal heart size likely accentuated by portable AP technique.The cardiomediastinal contours are normal. The lungs are clear. Pulmonary vasculature is normal. No consolidation, pleural effusion, or pneumothorax. No acute osseous abnormalities are seen. IMPRESSION: No active disease. Electronically Signed   By: Narda Rutherford M.D.   On: 05/19/2023 21:28   VAS [place] LOWER EXTREMITY VENOUS REFLUX Result Date: 04/26/2023  Lower Venous Reflux Study Patient Name:  TEVIS DUNAVAN  Date of Exam:   04/26/2023 Medical Rec #: [id number]    Accession #:    [id number] Date of Birth: Jul 09, 1980     Patient Gender: M Patient Age:   62 years Exam Location:  [facility] Procedure:      VAS [place] LOWER EXTREMITY VENOUS REFLUX Referring Phys: Heath Lark  --------------------------------------------------------------------------------  Indications: Pain, Swelling, varicosities, and SVT.  Risk Factors: SVT. Performing Technologist: Criss Rosales RVT  Examination Guidelines: A complete evaluation includes B-mode imaging, spectral Doppler, color Doppler, and power Doppler as needed of all accessible portions of each vessel. Bilateral testing is considered an integral part of a complete examination. Limited examinations for reoccurring indications may be performed as noted. The reflux portion of the exam is performed with the patient in reverse Trendelenburg. Significant venous reflux is defined as >500 ms in the superficial venous system, and >1 second in the deep venous system.  Venous Reflux Times +---------------------+---------+------+-----------+------------+--------------+ RIGHT                Reflux NoRefluxReflux TimeDiameter cmsComments                                      Yes                                        +---------------------+---------+------+-----------+------------+--------------+ CFV                            yes   >1 second                            +---------------------+---------+------+-----------+------------+--------------+ FV mid                         yes   >1 second                            +---------------------+---------+------+-----------+------------+--------------+ Popliteal            no                                                   +---------------------+---------+------+-----------+------------+--------------+ GSV at SFJ                     yes    >500 ms  0.77                   +---------------------+---------+------+-----------+------------+--------------+ GSV prox thigh                 yes    >500 ms      0.47                   +---------------------+---------+------+-----------+------------+--------------+ GSV mid thigh                  yes    >500 ms      0.58                    +---------------------+---------+------+-----------+------------+--------------+ GSV dist thigh                 yes    >500 ms      0.58                   +---------------------+---------+------+-----------+------------+--------------+ GSV at knee                    yes    >500 ms      0.91                   +---------------------+---------+------+-----------+------------+--------------+ GSV prox calf                         >500 ms      0.41                   +---------------------+---------+------+-----------+------------+--------------+ SSV Pop Fossa        no                            0.37                   +---------------------+---------+------+-----------+------------+--------------+ SSV prox calf        no                            0.22                   +---------------------+---------+------+-----------+------------+--------------+ SSV mid calf         no                            0.19                   +---------------------+---------+------+-----------+------------+--------------+ Posterior                                                  acute thrombus Thigh/Lateral Knee                                                        +---------------------+---------+------+-----------+------------+--------------+ Medial Knee                    yes    >500 ms      0.52                   +---------------------+---------+------+-----------+------------+--------------+  +--------------+--------+------+----------+------------+-----------------------+  LEFT          Reflux  Reflux  Reflux  Diameter cmsComments                              No       Yes     Time                                       +--------------+--------+------+----------+------------+-----------------------+ CFV                    yes  >1 second                                      +--------------+--------+------+----------+------------+-----------------------+ FV mid                 yes  >1 second                                     +--------------+--------+------+----------+------------+-----------------------+ Popliteal              yes  >1 second                                     +--------------+--------+------+----------+------------+-----------------------+ GSV at SFJ             yes   >500 ms      0.97                            +--------------+--------+------+----------+------------+-----------------------+ GSV prox thigh         yes   >500 ms      1.04                            +--------------+--------+------+----------+------------+-----------------------+ GSV mid thigh          yes   >500 ms      0.90                            +--------------+--------+------+----------+------------+-----------------------+ GSV dist thigh         yes   >500 ms      0.96                            +--------------+--------+------+----------+------------+-----------------------+ GSV at knee            yes   >500 ms      1.10    chronic thrombus        +--------------+--------+------+----------+------------+-----------------------+ GSV prox calf          yes   >500 ms      0.60                            +--------------+--------+------+----------+------------+-----------------------+ SSV Pop Fossa no                          0.31                            +--------------+--------+------+----------+------------+-----------------------+  SSV prox calf no                          0.34                            +--------------+--------+------+----------+------------+-----------------------+ 0.80                   yes                        Medial distal                                                             thigh/knee              +--------------+--------+------+----------+------------+-----------------------+    Summary: Right: - No evidence of deep vein thrombosis seen in the right lower extremity, from the common femoral through the popliteal veins. - Evidence of acute superficial thrombus within the varicosities of the posterior thigh and extends through the lateral knee. - Venous reflux identified in the common femoral and femoral veins. - Venous reflux identified in the saphenofemoral junction, great saphenous vein (thigh, knee, calf) and associated varicosities.  Left: - No evidence of deep vein thrombosis seen in the left lower extremity, from the common femoral through the popliteal veins. 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 - Venous reflux identified in the common femoral, femoral and popliteal veins. - Venous reflux identified in the saphenofemoral junction, great saphenous vein (thigh, knee, calf) and associated varicosities.  *See table(s) above for measurements and observations. Electronically signed by Heath Lark on 04/26/2023 at 4:27:05 PM.    Final     Microbiology: Results for orders placed or performed during the hospital encounter of 05/19/23  Urine Culture     Status: None   Collection Time: 05/19/23  8:01 PM   Specimen: Urine, Clean Catch  Result Value Ref Range Status   Specimen Description   Final    URINE, CLEAN CATCH Performed at [facility], [address] [place], [place] [address]    Special Requests   Final    NONE Performed at [facility], [address] [place], [place] [address]    Culture   Final    NO GROWTH Performed at [facility], [address], [place], [place] [address]    Report Status 05/21/2023 FINAL  Final  MRSA Next Gen by PCR, Nasal     Status: None   Collection Time: 05/20/23  1:26 AM   Specimen: Nasal Mucosa; Nasal Swab  Result Value Ref Range Status   MRSA by PCR Next Gen NOT DETECTED NOT DETECTED Final    Comment: (NOTE) The GeneXpert MRSA Assay (FDA approved for  NASAL specimens only), is one component of a comprehensive MRSA colonization surveillance program. It is not intended to diagnose MRSA infection nor to guide or monitor treatment for MRSA infections. Test performance is not FDA approved in patients less than 66 years old. Performed at [facility], [address] [place], [place] [address]     Labs: CBC: Recent Labs  Lab 05/19/23 1737 05/20/23 0546  WBC 7.9 7.4  NEUTROABS  4.3  --   HGB 15.7 14.6  HCT 47.2 44.2  MCV 88.7 89.5  PLT 220 161   Basic Metabolic Panel: Recent Labs  Lab 05/19/23 1737 05/20/23 0546 05/21/23 0505  NA 142 141 136  K 3.1* 3.4* 3.5  CL 107 113* 107  CO2 20* 21* 23  GLUCOSE 90 97 92  BUN 32* 24* 19  CREATININE 0.93 0.72 0.67  CALCIUM 9.2 8.2* 7.9*  MG 1.9  --  2.1   Liver Function Tests: No results for input(s): "AST", "ALT", "ALKPHOS", "BILITOT", "PROT", "ALBUMIN" in the last 168 hours.  CBG: Recent Labs  Lab 05/20/23 0117  GLUCAP 91    Discharge time spent: greater than 30 minutes.  Signed: Vassie Loll, MD [facility] 05/23/2023

## 2023-05-24 ENCOUNTER — Telehealth: Payer: Self-pay

## 2023-05-24 NOTE — Transitions of Care (Post Inpatient/ED Visit) (Signed)
   05/24/2023  Name: Eddie Marshall MRN: [id number] DOB: Jul 10, 1980  Today's TOC FU Call Status: Today's TOC FU Call Status:: Unsuccessful Call (1st Attempt) Unsuccessful Call (1st Attempt) Date: 05/24/23  Attempted to reach the patient regarding the most recent Inpatient/ED visit.  Follow Up Plan: Additional outreach attempts will be made to reach the patient to complete the Transitions of Care (Post Inpatient/ED visit) call.   Alan Ee, RN, BSN, CEN [employer]- Transition of Care Team.  [facility] [phone]

## 2023-05-25 ENCOUNTER — Telehealth: Payer: Self-pay

## 2023-05-25 NOTE — Transitions of Care (Post Inpatient/ED Visit) (Signed)
 05/25/2023  Name: Eddie Marshall MRN: [id number] DOB: May 04, 1980  Today's TOC FU Call Status: Today's TOC FU Call Status:: Successful TOC FU Call Completed TOC FU Call Complete Date: 05/25/23 Patient's Name and Date of Birth confirmed.  Transition Care Management Follow-up Telephone Call How have you been since you were released from the hospital?: Better (Feeling better.) Any questions or concerns?: Yes Patient Questions/Concerns:: Does not know where urology office is. Patient Questions/Concerns Addressed: Provided Patient Educational Materials (Provided patient with address and phone number for urology- Dr. Belvie Clara)  Items Reviewed: Did you receive and understand the discharge instructions provided?: Yes Medications obtained,verified, and reconciled?: Yes (Medications Reviewed) Any new allergies since your discharge?: No Dietary orders reviewed?: Yes Type of Diet Ordered:: heart healthy Do you have support at home?: Yes People in Home: sibling(s) Name of Support/Comfort Primary Source: Kim Pipps  Medications Reviewed Today: Medications Reviewed Today     Reviewed by Rumalda Alan PENNER, RN (Registered Nurse) on 05/25/23 at [phone]  Med List Status: <None>   Medication Order Taking? Sig Documenting Provider Last Dose Status Informant  acetaminophen  (TYLENOL ) 325 MG tablet [id number] No Take 2 tablets (650 mg total) by mouth every 6 (six) hours as needed for mild pain (pain score 1-3) (or Fever >/= 101).  Patient not taking: Reported on 05/25/2023   Ricky Fines, MD Not Taking Active   albuterol  (VENTOLIN  HFA) 108 [phone] Base) MCG/ACT inhaler [id number] Yes Inhale 2 puffs into the lungs every 6 (six) hours as needed for wheezing or shortness of breath. Melvenia Manus BRAVO, MD Taking Active Self, Pharmacy Records           Med Note (COFFELL, JON HERO   Fri May 20, 2023 12:08 PM) Has available, unknown last dose.  aspirin EC 81 MG tablet [id number] Yes Take 81 mg by mouth daily. [provider] Taking Active Self, Pharmacy Records  cefadroxil  (DURICEF) 500 MG capsule [id number] Yes Take 1 capsule (500 mg total) by mouth 2 (two) times daily for 7 days. Ricky Fines, MD Taking Active   methocarbamol  (ROBAXIN ) 500 MG tablet [id number] No Take 1 tablet (500 mg total) by mouth every 6 (six) hours as needed for muscle spasms.  Patient not taking: Reported on 05/25/2023   Melvenia Manus BRAVO, MD Not Taking Active Self, Pharmacy Records  olmesartan  (BENICAR ) 40 MG tablet [id number] No Take 1 tablet (40 mg total) by mouth daily.  Patient not taking: Reported on 05/25/2023   Melvenia Manus BRAVO, MD Not Taking Active Self, Pharmacy Records  Semaglutide -Weight Management 2.4 MG/0.75ML SOAJ [id number] Yes Inject 2.4 mg into the skin once a week.  Patient taking differently: Inject 2.4 mg into the skin every Friday.   Melvenia Manus BRAVO, MD Taking Active Self, Pharmacy Records            Home Care and Equipment/Supplies: Were Home Health Services Ordered?: No Any new equipment or medical supplies ordered?: No  Functional Questionnaire: Do you need assistance with bathing/showering or dressing?: No Do you need assistance with meal preparation?: No Do you need assistance with eating?: No Do you have difficulty maintaining continence: Yes (current has a foley) Do you need assistance with getting out of bed/getting out of a chair/moving?: No Do you have difficulty managing or taking your medications?: No  Follow up appointments reviewed: PCP Follow-up appointment confirmed?: No (States will call today for an appointment) MD Provider Line Number:[phone] Given: No Specialist Hospital Follow-up appointment confirmed?: Yes Date of Specialist follow-up appointment?: 05/30/23  Follow-Up Specialty Provider:: Dr. Belvie Clara Do you need transportation to your follow-up appointment?: No Do you understand care options if your condition(s) worsen?: Yes-patient verbalized  understanding  SDOH Interventions Today    Flowsheet Row Most Recent Value  SDOH Interventions   Food Insecurity Interventions Intervention Not Indicated  Housing Interventions Intervention Not Indicated  Transportation Interventions Intervention Not Indicated  Utilities Interventions Intervention Not Indicated      Patient reports that he is doing very well. Reports that he is ready to get the foley catheter out.  States he has an appointment on 05/30/2023.  Patient reports that he has all his medications and is taking them as prescribed.  Patient denies any needs at this time.   I reviewed and offered 30 day TOC program and patient declines. I provided my name and phone number if patient has questions in the future.   Interventions Today    Flowsheet Row Most Recent Value  Chronic Disease   Chronic disease during today's visit Other  [uti]  General Interventions   General Interventions Discussed/Reviewed General Interventions Discussed, General Interventions Reviewed, Doctor Visits  Doctor Visits Discussed/Reviewed Doctor Visits Discussed  Education Interventions   Education Provided Provided Education  [Reviewed importance of follow up. Provided patient with contact number and address of urology.  Reviewed with patient how to get a new insurance card. Reviewed with patient and provided contact telephone number for [employer].]  Provided Verbal Education On Nutrition, Medication, When to see the doctor  Nutrition Interventions   Nutrition Discussed/Reviewed Nutrition Discussed  Pharmacy Interventions   Pharmacy Dicussed/Reviewed Medications and their functions      TOC Interventions Today    Flowsheet Row Most Recent Value  TOC Interventions   TOC Interventions Discussed/Reviewed TOC Interventions Discussed, TOC Interventions Reviewed, S/S of infection       Alan Ee, RN, BSN, [employer]- Transition of Care Team.  [facility] [phone]

## 2023-05-27 ENCOUNTER — Ambulatory Visit: Payer: Medicaid Other

## 2023-05-27 ENCOUNTER — Inpatient Hospital Stay: Payer: Self-pay | Admitting: Family Medicine

## 2023-05-27 DIAGNOSIS — R339 Retention of urine, unspecified: Secondary | ICD-10-CM

## 2023-05-27 NOTE — Progress Notes (Signed)
 Fill and Pull Catheter Removal  Patient is present today for a catheter removal.  Patient was cleaned and prepped in a sterile fashion of sterile water / saline was instilled into the bladder when the patient felt the urge to urinate. 10ml of water  was then drained from the balloon.  A 16FR foley cath was removed from the bladder no complications were noted .  Patient as then given some time to void on their own.  Patient can void  on their own after some time.  Patient tolerated well.  Performed by: Exie DASEN. CMA  Follow up/ Additional notes: As scheduled

## 2023-05-30 ENCOUNTER — Ambulatory Visit: Payer: Self-pay | Admitting: Internal Medicine

## 2023-05-30 ENCOUNTER — Other Ambulatory Visit: Payer: Self-pay | Admitting: Internal Medicine

## 2023-05-30 MED ORDER — OLMESARTAN MEDOXOMIL 40 MG PO TABS: 40.0000 mg | ORAL_TABLET | Freq: Every day | ORAL | 0 refills | Status: DC

## 2023-05-30 NOTE — Telephone Encounter (Signed)
  Chief Complaint: left leg pain Symptoms: left calf, knee and thigh pain, left knee swelling Frequency: x 1 week Pertinent Negatives: Patient denies redness to left leg, chest pain, difficulty breathing, dizziness, fever, numbness, weakness. Disposition: [] ED /[] Urgent Care (no appt availability in office) / [x] Appointment(In office/virtual)/ []  [facility]/ [] Home Care/ [x] Refused Recommended Disposition /[] [facility]/ []  Follow-up with PCP Additional Notes: Patient recently hospitalized 05/19/23 for his afib. Patient states while in the hospital he had left calf, knee and thigh pain. He states he thought it was from the catheter tap on his leg but states after the catheter was removed he noticed he still had severe pain. 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 Patient refused acute appointments today with other PCP locations and refused urgent care "they tried to send me in an ambulance to the hospital last time". Advised patient that symptoms can be emergent and indicate a DVT. Patient verbalizes understanding to call back for any new or worsening symptoms.  Copied from CRM [id number]. 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 He was originally calling for a referral to a vascular provider because he is concerned that it is a blood clot. Reason for Disposition  [1] Thigh or calf pain AND [2] only 1 side AND [3] present > 1 hour (Exception: Chronic unchanged pain.)  Answer Assessment - Initial Assessment Questions 1. ONSET: "When did the pain start?"      X1 week.  2. LOCATION: "Where is the pain located?"      Left leg calf painful, tender to touch inner thigh and calf "feels like I can feel the vein", medial and posterior knee he  states there is a painful "cluster of veins"  3. PAIN: "How bad is the pain?"    (Scale 1-10; or mild, moderate, severe)   -  MILD (1-3): doesn't interfere with normal activities    -  MODERATE (4-7): interferes with normal activities (e.g., work or school) or awakens from sleep, limping    -  SEVERE (8-10): excruciating pain, unable to do any normal activities, unable to walk     7-8/10.  4. WORK OR EXERCISE: "Has there been any recent work or exercise that involved this part of the body?"      Denies.  5. CAUSE: "What do you think is causing the leg pain?"     Patient states there has been a blood clot there for years and worse after recent hospitalization.  6. OTHER SYMPTOMS: "Do you have any other symptoms?" (e.g., chest pain, back pain, breathing difficulty, swelling, rash, fever, numbness, weakness)     Swelling to left knee.  Protocols used: Leg Pain-A-AH

## 2023-05-30 NOTE — Telephone Encounter (Signed)
 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 Pt was seen by a surgeon in Jan- told to wear compression socks and take baby aspirin. Pt states the one in the right leg went away but the left leg blood clot is still there. Pt states his pain yesterday was a 7/10 and today 8/10. Pt advised to go to ED. Pt states he is going to work and will go tomorrow. Pt strongly urged to go tonight. Unable to call [facility] due to office being closed at this time.      Copied from CRM [id number]. Topic: Clinical - Red Word Triage >> May 30, 2023  4:53 PM [place] wrote: Red Word that prompted transfer to Nurse Triage: has known blood clots in legs, left leg is painful at 8/10 right now Reason for Disposition  Sounds like a life-threatening emergency to the triager  Answer Assessment - Initial Assessment Questions 1. ONSET: "When did the pain start?"      Past week 2. LOCATION: "Where is the pain located?"      Right side of left leg, above ankle there is a vein that is painful; bulge of knee 3. PAIN: "How bad is the pain?"    (Scale 1-10; or mild, moderate, severe)   -  MILD (1-3): doesn't interfere with normal activities    -  MODERATE (4-7): interferes with normal activities (e.g., work or school) or awakens from sleep, limping    -  SEVERE (8-10): excruciating pain, unable to do any normal activities, unable to walk     8 5. CAUSE: "What do you think is causing the leg pain?"     Blood clot 6. OTHER SYMPTOMS: "Do you have any other symptoms?" (e.g., chest pain, back pain, breathing difficulty, swelling, rash,  fever, numbness, weakness)     Swelling  Protocols used: Leg Pain-A-AH

## 2023-05-30 NOTE — Telephone Encounter (Signed)
 Copied from CRM [id number]. Topic: Clinical - Medication Refill >> May 30, 2023 11:30 AM Elle L wrote: Most Recent Primary Care Visit:  Provider: DIXON, PHILLIP E  Department: [facility]  Visit Type: OFFICE VISIT  Date: 03/15/2023  Medication: olmesartan  (BENICAR ) 40 MG tablet   Has the patient contacted their pharmacy? Yes, he was advised to reach out to his provider.  Is this the correct pharmacy for this prescription? Yes If no, delete pharmacy and type the correct one.  This is the patient's preferred pharmacy:  [facility] [phone] - [place], [place] - [address] AT [facility] [address] [place] [address] Phone: [phone] Fax: [phone]  Has the prescription been filled recently? No  Is the patient out of the medication? Yes, he has 2 left.   Has the patient been seen for an appointment in the last year OR does the patient have an upcoming appointment? Yes  Can we respond through MyChart? Yes  Agent: Please be advised that Rx refills may take up to 3 business days. We ask that you follow-up with your pharmacy.

## 2023-05-31 ENCOUNTER — Other Ambulatory Visit (HOSPITAL_BASED_OUTPATIENT_CLINIC_OR_DEPARTMENT_OTHER): Payer: Self-pay

## 2023-05-31 ENCOUNTER — Encounter (HOSPITAL_BASED_OUTPATIENT_CLINIC_OR_DEPARTMENT_OTHER): Payer: Self-pay | Admitting: Emergency Medicine

## 2023-05-31 ENCOUNTER — Emergency Department (HOSPITAL_BASED_OUTPATIENT_CLINIC_OR_DEPARTMENT_OTHER): Payer: Medicaid Other

## 2023-05-31 ENCOUNTER — Emergency Department (HOSPITAL_BASED_OUTPATIENT_CLINIC_OR_DEPARTMENT_OTHER)
Admission: EM | Admit: 2023-05-31 | Discharge: 2023-05-31 | Disposition: A | Discharge status: Home / Self Care | Payer: Medicaid Other | Attending: Emergency Medicine | Admitting: Emergency Medicine

## 2023-05-31 DIAGNOSIS — I82812 Embolism and thrombosis of superficial veins of left lower extremities: Secondary | ICD-10-CM | POA: Diagnosis not present

## 2023-05-31 DIAGNOSIS — M7989 Other specified soft tissue disorders: Secondary | ICD-10-CM | POA: Diagnosis present

## 2023-05-31 DIAGNOSIS — I82890 Acute embolism and thrombosis of other specified veins: Secondary | ICD-10-CM

## 2023-05-31 DIAGNOSIS — Z7982 Long term (current) use of aspirin: Secondary | ICD-10-CM | POA: Diagnosis not present

## 2023-05-31 MED ORDER — RIVAROXABAN (XARELTO) VTE STARTER PACK (15 & 20 MG)
ORAL_TABLET | ORAL | 0 refills | Status: DC
  Filled 2023-05-31: qty 51, 30d supply, fill #0

## 2023-05-31 NOTE — ED Notes (Signed)
Pt verbalized understanding of d/c instructions, meds, and followup care. Denies questions. VSS, no distress noted. Steady gait to exit with all belongings. Teaching for Xarelto performed.

## 2023-05-31 NOTE — ED Provider Notes (Signed)
[place] EMERGENCY DEPARTMENT AT [facility] Provider Note   CSN: [id number] Arrival date & time: 05/31/23  1349     History  Chief Complaint  Patient presents with   Leg Swelling    Shannan Garfinkel is a 43 y.o. male.  9 patient with swelling and tenderness to the left leg from the calf all the way up into the thigh.  Patient with a history of superficial vein thrombosis.  No shortness of breath no chest pain.  Patient is on a baby aspirin a day.  Patient was admitted January 30 for atrial fibs.  The patient not on any medications for atrial fibs.  Patient not on any blood thinners other than the baby aspirin.       Home Medications Prior to Admission medications   Medication Sig Start Date End Date Taking? Authorizing Provider  RIVAROXABAN Carlena Hurl) VTE STARTER PACK (15 & 20 MG) Follow package directions: Take one 15mg  tablet by mouth twice a day. On day 22, switch to one 20mg  tablet once a day. Take with food. 05/31/23  Yes Vanetta Mulders, MD  acetaminophen (TYLENOL) 325 MG tablet Take 2 tablets (650 mg total) by mouth every 6 (six) hours as needed for mild pain (pain score 1-3) (or Fever >/= 101). Patient not taking: Reported on 05/25/2023 05/23/23   Vassie Loll, MD  albuterol (VENTOLIN HFA) 108 (90 Base) MCG/ACT inhaler Inhale 2 puffs into the lungs every 6 (six) hours as needed for wheezing or shortness of breath. 01/26/23   Billie Lade, MD  aspirin EC 81 MG tablet Take 81 mg by mouth daily.    [provider]  methocarbamol (ROBAXIN) 500 MG tablet Take 1 tablet (500 mg total) by mouth every 6 (six) hours as needed for muscle spasms. Patient not taking: Reported on 05/25/2023 12/13/22   Billie Lade, MD  olmesartan (BENICAR) 40 MG tablet Take 1 tablet (40 mg total) by mouth daily. 05/30/23   Billie Lade, MD  Semaglutide-Weight Management 2.4 MG/0.75ML SOAJ Inject 2.4 mg into the skin once a week. Patient taking differently: Inject 2.4 mg into the skin  every Friday. 05/02/23   Billie Lade, MD      Allergies    Other    Review of Systems   Review of Systems  Constitutional:  Negative for chills and fever.  HENT:  Negative for ear pain and sore throat.   Eyes:  Negative for pain and visual disturbance.  Respiratory:  Negative for cough and shortness of breath.   Cardiovascular:  Positive for leg swelling. Negative for chest pain and palpitations.  Gastrointestinal:  Negative for abdominal pain and vomiting.  Genitourinary:  Negative for dysuria and hematuria.  Musculoskeletal:  Negative for arthralgias and back pain.  Skin:  Negative for color change and rash.  Neurological:  Negative for seizures and syncope.  All other systems reviewed and are negative.   Physical Exam Updated Vital Signs BP (!) 136/98 (BP Location: Right Arm)   Pulse 71   Temp 98.9 F (37.2 C)   Resp 18   SpO2 99%  Physical Exam Vitals and nursing note reviewed.  Constitutional:      General: He is not in acute distress.    Appearance: Normal appearance. He is well-developed. He is not ill-appearing or toxic-appearing.  HENT:     Head: Normocephalic and atraumatic.  Eyes:     Extraocular Movements: Extraocular movements intact.     Conjunctiva/sclera: Conjunctivae normal.  Pupils: Pupils are equal, round, and reactive to light.  Cardiovascular:     Rate and Rhythm: Normal rate and regular rhythm.     Heart sounds: No murmur heard. Pulmonary:     Effort: Pulmonary effort is normal. No respiratory distress.     Breath sounds: Normal breath sounds.  Abdominal:     Palpations: Abdomen is soft.     Tenderness: There is no abdominal tenderness.  Musculoskeletal:        General: Swelling and tenderness present.     Cervical back: Normal range of motion and neck supple.     Comments: Left leg dorsalis pedis pulses 1+.  There are swelling to the left calf some tenderness to palpation to the medial aspect of the calf and thigh.  No erythema.  Some  evidence of some varicose veins.  Skin:    General: Skin is warm and dry.     Capillary Refill: Capillary refill takes less than 2 seconds.  Neurological:     General: No focal deficit present.     Mental Status: He is alert and oriented to person, place, and time.  Psychiatric:        Mood and Affect: Mood normal.     ED Results / Procedures / Treatments   Labs (all labs ordered are listed, but only abnormal results are displayed) Labs Reviewed - No data to display  EKG None  Radiology US Venous Img Lower Unilateral Left Result Date: 05/31/2023 CLINICAL DATA:  Swelling and tenderness of the left leg for the past week. Prior history of superficial thrombophlebitis. EXAM: LEFT LOWER EXTREMITY VENOUS DOPPLER ULTRASOUND TECHNIQUE: Gray-scale sonography with compression, as well as color and duplex ultrasound, were performed to evaluate the deep venous system(s) from the level of the common femoral vein through the popliteal and proximal calf veins. COMPARISON:  None Available. FINDINGS: VENOUS Normal compressibility of the common femoral, superficial femoral, and popliteal veins, as well as the visualized calf veins. Visualized portions of profunda femoral vein unremarkable. No filling defects to suggest DVT on grayscale or color Doppler imaging. Doppler waveforms show normal direction of venous flow, normal respiratory plasticity and response to augmentation. Limited views of the contralateral common femoral vein are unremarkable. OTHER Abnormal appearance of the superficial great saphenous vein. The great saphenous vein is noncompressible and expanded with the lumen filled with low-level echoes. No evidence of flow on color Doppler imaging. The vein is thrombosed to the level of the mid calf but patent at the ankle. Limitations: none IMPRESSION: 1. Positive for superficial thrombophlebitis of the great saphenous vein from near the saphenofemoral junction in the inguinal region to the mid calf.  2. No evidence of deep venous thrombosis. Electronically Signed   By: Malachy Moan M.D.   On: 05/31/2023 16:08    Procedures Procedures    Medications Ordered in ED Medications - No data to display  ED Course/ Medical Decision Making/ A&P                                 Medical Decision Making Risk Prescription drug management.   Doppler study shows evidence of superficial thrombophlebitis of the greater saphenous vein from near the saphenofemoral junction in the inguinal region to the mid calf.  So it is quite extensive.  Technically no evidence of a deep vein thrombosis.  But this is quite proximal.  So recommend treating him with Xarelto for deep  vein thrombosis.  And have him follow-up with his primary care doctor.  Patient nontoxic no acute distress.   Final Clinical Impression(s) / ED Diagnoses Final diagnoses:  Superficial vein thrombosis    Rx / DC Orders ED Discharge Orders          Ordered    RIVAROXABAN (XARELTO) VTE STARTER PACK (15 & 20 MG)        05/31/23 1650              Vanetta Mulders, MD 05/31/23 1656

## 2023-05-31 NOTE — ED Triage Notes (Signed)
Swelling tenderness of left leg. Recent hospitalized Hx DVT Denies Sob or chest pain

## 2023-05-31 NOTE — Telephone Encounter (Signed)
Spoke to patient, he is going to go to urgent care

## 2023-05-31 NOTE — Discharge Instructions (Addendum)
Start the Xarelto pack take as directed.  Sent to the pharmacy here.  Make an appointment to follow-up with your regular doctor Dr. Durwin Nora.  We decided to go ahead and treat this because it was quite extensive.  From the left calf all the way up into the inguinal area.  Involving just the greater saphenous area.  But close to the junction.  Based on that probably wise to treat with anticoagulation.  Return for any chest pain or shortness of breath.  As we discussed you could hold the baby aspirin a day.

## 2023-06-01 ENCOUNTER — Ambulatory Visit: Payer: Self-pay | Admitting: Internal Medicine

## 2023-06-01 NOTE — Patient Instructions (Signed)
        Great to see you today.  I have refilled the medication(s) we provide.    You have a condition called superficial thrombophlebitis (STP), which means there's a blood clot in a vein near the surface of your skin in your leg. This clot is in a big vein (called the greater saphenous vein) that runs from your thigh to your calf. While this is not as serious as a deep vein clot, it's in a location where it could become more dangerous and move into deeper veins.  To treat this, you were started Xarelto, a blood thinner, to prevent the clot from getting worse. You may also need pain relief and compression stockings to help with swelling. It's important to stay active, so try to avoid sitting still for too long.  Please watch for any increased leg pain, swelling, or shortness of breath. If you notice these, let us know right away, as it could be more serious.      If labs were collected, we will inform you of lab results once received either by echart message or telephone call.   - echart message- for normal results that have been seen by the patient already.   - telephone call: abnormal results or if patient has not viewed results in their echart.   - Please take medications as prescribed. - Follow up with your primary health provider if any health concerns arises. - If symptoms worsen please contact your primary care provider and/or visit the emergency department.

## 2023-06-01 NOTE — Progress Notes (Unsigned)
   Established Patient Office Visit   Subjective  Patient ID: Eddie Marshall, male    DOB: [birth year]  Age: 43 y.o. MRN: [id number]  No chief complaint on file.   He  has a past medical history of Arthritis, Complication of anesthesia, Hip disease, Hypertension, and Seizures (HCC).  HPI  ROS    Objective:     There were no vitals taken for this visit. {Vitals History (Optional):23777}  Physical Exam   No results found for any visits on 06/03/23.  The ASCVD Risk score (Arnett DK, et al., 2019) failed to calculate for the following reasons:   The valid total cholesterol range is 130 to 320 mg/dL    Assessment & Plan:  There are no diagnoses linked to this encounter.  No follow-ups on file.   Cruzita Lederer Newman Nip, FNP

## 2023-06-01 NOTE — Telephone Encounter (Signed)
  Chief Complaint: Medication question  Disposition: [] ED /[] Urgent Care (no appt availability in office) / [] Appointment(In office/virtual)/ []  [facility]/ [x] Home Care/ [] Refused Recommended Disposition /[] [place] [facility]/ []  Follow-up with PCP Additional Notes: Patient calls for clarification on how to take prescribed xarelto. Asking if he can take both doses in the morning vs one in the morning and one in the evening. This RN advised patient to take medication as prescribed. Patient verbalized understanding. Denies further questions.   Copied from CRM [id number]. Topic: Clinical - Medication Question >> Jun 01, 2023 11:40 AM Gery Pray wrote: Reason for CRM: Patient would like to know if he could take both pills at the same time or as instructed on the bottle as 1 in the morning and one in the evening? Medication is a blood thinning medication that was prescribed for blood clots from his hospital visit on 02/11 Reason for Disposition  Caller has medicine question only, adult not sick, AND triager answers question  Answer Assessment - Initial Assessment Questions 1. NAME of MEDICINE: "What medicine(s) are you calling about?"     xarelto 2. QUESTION: "What is your question?" (e.g., double dose of medicine, side effect)     Was d/c from hospital with xarelto, asking if he can take both pills at one time or if he has to take as prescribed in am and pm. 3. PRESCRIBER: "Who prescribed the medicine?" Reason: if prescribed by specialist, call should be referred to that group.      ER physician 4. SYMPTOMS: "Do you have any symptoms?" If Yes, ask: "What symptoms are you having?"  "How bad are the symptoms (e.g., mild, moderate, severe)      Denies  Protocols used: Medication Question Call-A-AH

## 2023-06-02 NOTE — Telephone Encounter (Signed)
Patient advised, patient to see NP tomorrow

## 2023-06-03 ENCOUNTER — Other Ambulatory Visit (HOSPITAL_COMMUNITY): Payer: Self-pay

## 2023-06-03 ENCOUNTER — Ambulatory Visit: Payer: Medicaid Other | Admitting: Family Medicine

## 2023-06-03 ENCOUNTER — Encounter: Payer: Self-pay | Admitting: Family Medicine

## 2023-06-03 VITALS — BP 149/84 | HR 68 | Resp 16 | Ht 67.5 in | Wt 262.1 lb

## 2023-06-03 DIAGNOSIS — Z09 Encounter for follow-up examination after completed treatment for conditions other than malignant neoplasm: Secondary | ICD-10-CM | POA: Diagnosis not present

## 2023-06-03 MED ORDER — SEMAGLUTIDE-WEIGHT MANAGEMENT 2.4 MG/0.75ML ~~LOC~~ SOAJ
2.4000 mg | SUBCUTANEOUS | 2 refills | Status: DC
  Filled 2023-06-03: qty 3, 28d supply, fill #0
  Filled 2023-07-12 – 2023-07-14 (×2): qty 3, 28d supply, fill #1

## 2023-06-03 NOTE — Assessment & Plan Note (Signed)
BMP and CBC labs ordered. The hospital chart, including the discharge summary, was thoroughly reviewed Medications were thoroughly reviewed and reconciled with the patient.  Patient has a follow up with Urology appointment 06/24/23

## 2023-06-04 LAB — CBC WITH DIFFERENTIAL/PLATELET
Basophils Absolute: 0.1 10*3/uL (ref 0.0–0.2)
Basos: 1 %
EOS (ABSOLUTE): 0.2 10*3/uL (ref 0.0–0.4)
Eos: 4 %
Hematocrit: 42.2 % (ref 37.5–51.0)
Hemoglobin: 13.8 g/dL (ref 13.0–17.7)
Immature Grans (Abs): 0 10*3/uL (ref 0.0–0.1)
Immature Granulocytes: 0 %
Lymphocytes Absolute: 1.6 10*3/uL (ref 0.7–3.1)
Lymphs: 31 %
MCH: 29.9 pg (ref 26.6–33.0)
MCHC: 32.7 g/dL (ref 31.5–35.7)
MCV: 92 fL (ref 79–97)
Monocytes Absolute: 0.4 10*3/uL (ref 0.1–0.9)
Monocytes: 8 %
Neutrophils Absolute: 2.9 10*3/uL (ref 1.4–7.0)
Neutrophils: 56 %
Platelets: 228 10*3/uL (ref 150–450)
RBC: 4.61 x10E6/uL (ref 4.14–5.80)
RDW: 12.8 % (ref 11.6–15.4)
WBC: 5.1 10*3/uL (ref 3.4–10.8)

## 2023-06-04 LAB — BMP8+EGFR
BUN/Creatinine Ratio: 11 (ref 9–20)
BUN: 8 mg/dL (ref 6–24)
CO2: 27 mmol/L (ref 20–29)
Calcium: 9.2 mg/dL (ref 8.7–10.2)
Chloride: 101 mmol/L (ref 96–106)
Creatinine, Ser: 0.7 mg/dL — ABNORMAL LOW (ref 0.76–1.27)
Glucose: 87 mg/dL (ref 70–99)
Potassium: 4 mmol/L (ref 3.5–5.2)
Sodium: 139 mmol/L (ref 134–144)
eGFR: 116 mL/min/{1.73_m2} (ref >59)

## 2023-06-06 ENCOUNTER — Telehealth (INDEPENDENT_AMBULATORY_CARE_PROVIDER_SITE_OTHER): Payer: Medicaid Other | Admitting: Nurse Practitioner

## 2023-06-06 ENCOUNTER — Ambulatory Visit: Payer: Self-pay | Admitting: Internal Medicine

## 2023-06-06 ENCOUNTER — Encounter: Payer: Self-pay | Admitting: Nurse Practitioner

## 2023-06-06 VITALS — Ht 67.5 in | Wt 262.0 lb

## 2023-06-06 DIAGNOSIS — J069 Acute upper respiratory infection, unspecified: Secondary | ICD-10-CM

## 2023-06-06 MED ORDER — PROMETHAZINE-DM 6.25-15 MG/5ML PO SYRP: 5.0000 mL | ORAL_SOLUTION | Freq: Four times a day (QID) | ORAL | 0 refills | Status: DC | PRN

## 2023-06-06 NOTE — Telephone Encounter (Signed)
 Copied from CRM [id number]. Topic: Clinical - Red Word Triage >> Jun 06, 2023 11:41 AM Fuller Mandril wrote: Red Word that prompted transfer to Nurse Triage: Dizziness, feel like fever but hasn't taken temperature, sore throat  Chief Complaint: Coughing spells Symptoms: Runny nose, sore throat, body aches, dizziness Frequency: Since Friday-Saturday Pertinent Negatives: Patient denies relief Disposition: [] ED /[] Urgent Care (no appt availability in office) / [x] Appointment(In office/virtual)/ []  [facility]/ [] Home Care/ [] Refused Recommended Disposition /[]  Mobile Bus/ []  Follow-up with PCP Additional Notes: Patient called in to report coughing spells and additional cold/flu symptoms that started Friday-Saturday. Patient stated that his coughing spells are severe enough that they occasionally cause him to vomit. Patient stated that he experiences "a little" SOB while coughing, but denied SOB at rest, while laying down and moving. Patient able to speak in clear and complete sentences while on the phone with this RN. Patient is also experiencing runny nose, sore throat, body aches and dizziness. This RN advised patient to see a provider within 24 hours. No availability with PCP or in PCP office. No availability at another office within a distance the patient is willing to drive. Patient opted for a virtual visit. This RN scheduled patient for a same day virtual visit at an alternate office. This RN advised patient to call back if symptoms worsen. Patient complied.   Reason for Disposition  SEVERE coughing spells (e.g., whooping sound after coughing, vomiting after coughing)  Answer Assessment - Initial Assessment Questions 1. ONSET: "When did the cough begin?"      Saturday 2. SEVERITY: "How bad is the cough today?"      States if he coughs too much, he will throw up 3. SPUTUM: "Describe the color of your sputum" (none, dry cough; clear, white, yellow, green)     Light brownish to  clear 4. HEMOPTYSIS: "Are you coughing up any blood?" If so ask: "How much?" (flecks, streaks, tablespoons, etc.)     Denies 5. DIFFICULTY BREATHING: "Are you having difficulty breathing?" If Yes, ask: "How bad is it?" (e.g., mild, moderate, severe)    - MILD: No SOB at rest, mild SOB with walking, speaks normally in sentences, can lie down, no retractions, pulse < 100.    - MODERATE: SOB at rest, SOB with minimal exertion and prefers to sit, cannot lie down flat, speaks in phrases, mild retractions, audible wheezing, pulse 100-120.    - SEVERE: Very SOB at rest, speaks in single words, struggling to breathe, sitting hunched forward, retractions, pulse > 120      States he has a "little" SOB when coughing, denies SOB while laying flat and moving around 6. FEVER: "Do you have a fever?" If Yes, ask: "What is your temperature, how was it measured, and when did it start?"     States he feels feverish, but has not taken temperature 7. CARDIAC HISTORY: "Do you have any history of heart disease?" (e.g., heart attack, congestive heart failure)      Denies 8. LUNG HISTORY: "Do you have any history of lung disease?"  (e.g., pulmonary embolus, asthma, emphysema)     Denies 9. PE RISK FACTORS: "Do you have a history of blood clots?" (or: recent major surgery, recent prolonged travel, bedridden)     States he has had blood clots off and on since he was 13  10. OTHER SYMPTOMS: "Do you have any other symptoms?" (e.g., runny nose, wheezing, chest pain)       Dizziness when standing up,  runny nose, sore throat, body aches 11. PREGNANCY: "Is there any chance you are pregnant?" "When was your last menstrual period?"       Denies  Protocols used: Cough - Acute Productive-A-AH

## 2023-06-06 NOTE — Patient Instructions (Signed)
 It was great to see you!  Continue taking ibuprofen and dayquil  Start cough syrup every 4 hours as needed, this may make you sleepy  Stay out of work until you are fever free for 24 hours  Drink plenty of fluids and get rest.  Let's follow-up if your symptoms worsen or don't improve.   Take care,  Rodman Pickle, NP

## 2023-06-06 NOTE — Progress Notes (Signed)
 [facility] [address] [place] [address] Dept: [phone] Dept Fax: [phone]  Virtual Video Visit  I connected with Kerby Moors on 06/06/23 at  1:20 PM EST by a video enabled telemedicine application and verified that I am speaking with the correct person using two identifiers.  Location patient: Home Location provider: Clinic Persons participating in the virtual visit: Patient; Rodman Pickle, NP; Malena Peer, CMA  I discussed the limitations of evaluation and management by telemedicine and the availability of in person appointments. The patient expressed understanding and agreed to proceed.  Chief Complaint  Patient presents with   Cough    With sore throat, runny nose, body aches since late Friday night     SUBJECTIVE:  HPI: Mickel Schreur is a 43 y.o. male who presents with sore throat, body aches, and cough for 4 days.   UPPER RESPIRATORY TRACT INFECTION  Fever: yes, body aches Cough: yes  Shortness of breath: no Wheezing: no Chest pain: no Chest tightness: no Chest congestion: no Nasal congestion: yes Runny nose: yes Post nasal drip: no Sneezing: yes Sore throat: yes Swollen glands: no Sinus pressure: no Headache: yes Face pain: no Toothache: no Ear pain: no bilateral Ear pressure: no bilateral Eyes red/itching:no Eye drainage/crusting: no  Vomiting: yes Rash: no Fatigue: yes Sick contacts: no Strep contacts: no  Context: stable Recurrent sinusitis: no Relief with OTC cold/cough medications: yes  Treatments attempted: ibuprofen, dayquil    Patient Active Problem List   Diagnosis Date Noted   Hospital discharge follow-up 06/03/2023   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   Acute cystitis with hematuria 05/23/2023   Urinary retention 05/20/2023   Paroxysmal atrial fibrillation with RVR (HCC) 05/20/2023   Hypokalemia 05/20/2023   Sepsis due to gram-negative  UTI (HCC) 05/19/2023   Superficial vein thrombosis 12/29/2022   Leg mass, left 12/13/2022   Severe obstructive sleep apnea 12/13/2022   Vitamin D deficiency 09/15/2022   Hyperbilirubinemia 09/15/2022   Intertrigo 09/15/2022   Essential hypertension 08/13/2022   Morbid obesity (HCC) 08/13/2022   Osteoarthritis of hips, bilateral 08/13/2022   Snoring 08/13/2022   Persistent cough 08/13/2022   Dizziness 08/13/2022   Encounter for general adult medical examination with abnormal findings 08/13/2022    Past Surgical History:  Procedure Laterality Date   CYSTOSCOPY WITH RETROGRADE URETHROGRAM N/A 02/06/2016   Procedure: CYSTOSCOPY WITH RETROGRADE URETHROGRAM;  Surgeon: Bjorn Pippin, MD;  Location: [facility];  Service: Urology;  Laterality: N/A;   CYSTOSCOPY WITH URETHRAL DILATATION N/A 02/06/2016   Procedure: CYSTOSCOPY WITH URETHRAL DILATATION;  Surgeon: Bjorn Pippin, MD;  Location: [facility];  Service: Urology;  Laterality: N/A;   HIP SURGERY Left 1993 1995 1996   x 3   KNEE SURGERY Right 1999    Family History  Problem Relation Age of Onset   Diabetes Mother    Hypertension Mother    Hypertension Father     Social History   Tobacco Use   Smoking status: Never   Smokeless tobacco: Never  Vaping Use   Vaping status: Never Used  Substance Use Topics   Alcohol use: No   Drug use: No     Current Outpatient Medications:    albuterol (VENTOLIN HFA) 108 (90 Base) MCG/ACT inhaler, Inhale 2 puffs into the lungs every 6 (six) hours as needed for wheezing or shortness of breath., Disp: 8 g, Rfl: 2   olmesartan (BENICAR) 40 MG tablet, Take 1 tablet (40 mg total) by mouth  daily., Disp: 90 tablet, Rfl: 0   promethazine-dextromethorphan (PROMETHAZINE-DM) 6.25-15 MG/5ML syrup, Take 5 mLs by mouth 4 (four) times daily as needed for cough., Disp: 118 mL, Rfl: 0   RIVAROXABAN (XARELTO) VTE STARTER PACK (15 & 20 MG), Follow package directions: Take one 15mg  tablet by mouth twice a day. On day 22,  switch to one 20mg  tablet once a day. Take with food., Disp: 51 each, Rfl: 0   Semaglutide-Weight Management 2.4 MG/0.75ML SOAJ, Inject 2.4 mg into the skin once a week., Disp: 3 mL, Rfl: 2   acetaminophen (TYLENOL) 325 MG tablet, Take 2 tablets (650 mg total) by mouth every 6 (six) hours as needed for mild pain (pain score 1-3) (or Fever >/= 101). (Patient not taking: Reported on 06/06/2023), Disp: , Rfl:    aspirin EC 81 MG tablet, Take 81 mg by mouth daily. (Patient not taking: Reported on 06/06/2023), Disp: , Rfl:    methocarbamol (ROBAXIN) 500 MG tablet, Take 1 tablet (500 mg total) by mouth every 6 (six) hours as needed for muscle spasms. (Patient not taking: Reported on 06/06/2023), Disp: 30 tablet, Rfl: 2  Allergies  Allergen Reactions   Other Anaphylaxis    Epidural anesthesia    ROS: See pertinent positives and negatives per HPI.  OBSERVATIONS/OBJECTIVE:  VITALS per patient if applicable: Today's Vitals   06/06/23 1304  Weight: 262 lb (118.8 kg)  Height: 5' 7.5" (1.715 m)   Body mass index is 40.43 kg/m.    GENERAL: Alert and oriented. Appears sick, sweating and in no acute distress.  HEENT: Atraumatic. Conjunctiva clear. No obvious abnormalities on inspection of external nose and ears.  NECK: Normal movements of the head and neck.  LUNGS: On inspection, no signs of respiratory distress. Breathing rate appears normal. No obvious gross SOB, gasping or wheezing, and no conversational dyspnea.  CV: No obvious cyanosis.  MS: Moves all visible extremities without noticeable abnormality.  PSYCH/NEURO: Pleasant and cooperative. No obvious depression or anxiety. Speech and thought processing grossly intact.  ASSESSMENT AND PLAN:  Problem List Items Addressed This Visit   None Visit Diagnoses       Upper respiratory tract infection, unspecified type    -  Primary   Most likely URI, outside window for flu treatment, no sick conacts. Continue supportive treatment, rest,  fluids. Promethazine dm prn cough. F/U if not improving        I discussed the assessment and treatment plan with the patient. The patient was provided an opportunity to ask questions and all were answered. The patient agreed with the plan and demonstrated an understanding of the instructions.   The patient was advised to call back or seek an in-person evaluation if the symptoms worsen or if the condition fails to improve as anticipated.   Gerre Scull, NP

## 2023-06-08 ENCOUNTER — Inpatient Hospital Stay: Payer: Self-pay | Admitting: Family Medicine

## 2023-06-08 ENCOUNTER — Ambulatory Visit: Payer: Medicaid Other

## 2023-06-13 ENCOUNTER — Ambulatory Visit: Payer: Medicaid Other

## 2023-06-13 ENCOUNTER — Ambulatory Visit (INDEPENDENT_AMBULATORY_CARE_PROVIDER_SITE_OTHER): Payer: Medicaid Other

## 2023-06-13 DIAGNOSIS — R339 Retention of urine, unspecified: Secondary | ICD-10-CM

## 2023-06-13 NOTE — Progress Notes (Signed)
 Bladder Scan completed today.  Patient can void prior to the bladder scan. Bladder scan result: 29  Performed By: Alfonse Spruce. CMA  Additional notes-

## 2023-06-15 ENCOUNTER — Other Ambulatory Visit (HOSPITAL_COMMUNITY): Payer: Self-pay

## 2023-06-17 ENCOUNTER — Ambulatory Visit: Payer: Self-pay | Admitting: Internal Medicine

## 2023-06-17 NOTE — Telephone Encounter (Signed)
 Chief Complaint: pain Symptoms: L sided rib pain Frequency: after a fall 2 wks ago Pertinent Negatives: Patient denies CP, dizziness Disposition: [x] ED /[] Urgent Care (no appt availability in office) / [] Appointment(In office/virtual)/ []  [facility]/ [] Home Care/ [] Refused Recommended Disposition /[] [place] [facility]/ []  Follow-up with PCP Additional Notes: Pt reports L sided rib pain after falling 2 wks ago. Pt states he became very dizzy while taking a bath. Pt states he fell twice while getting out of the tub, and fell two more times afterwards. Pt fell a total of 4 times that evening. Denies that he hit his head. Does endorse LOC "for a couple seconds." States it felt like the light in his head went out. Pt is on Xarelto for a DVT Hx of Afib. Pt was not assessed by a HCP after falling. No external bruising or lacerations. 7/10 pain at night and 4/10 pain during the day. Pt reports he is getting of the flu and "sometimes" has difficulty breathing with coughing. No CP or difficulty breathing at rest without coughing. Coughing is painful d/t rib pain. Given that pt is on Xarelto and has pain and has not been assessed by a HCP after the falls he sustained 2 weeks ago with a LOC, RN advised pt he needs to go to the ED. Pt states he is at work currently and has not had the time to go. Pt curious if he can be scheduled to come into the office or be prescribed something for pain. RN called the [facility] to inform of likely ED refusal. Office has no availability until April. RN advised pt that the ED can prescribe him medications for pain and do tests to rule out fractures or internal bleeding. Pt states he will try to go to the ED at [facility] when he has time. RN advised pt he needs to call 911 if he has CP or difficulty breathing not with coughing, to which he verbalized understanding.    Reason for Disposition  History of prior "blood clot" in leg or lungs (i.e., deep vein thrombosis, pulmonary  embolism)  Answer Assessment - Initial Assessment Questions 1. MECHANISM: "How did the injury happen?"     Fell two weeks ago getting out of the tub. Pt was admitted for A fib 1/30, second admission for DVT (on Xarelto). On week 3 of a 5 week trial. On 2/10 pt took a bath, when he got up he got dizzy and his body "shut off" and he fell in the tub. Pt got up and fell a 2nd time. Pt crawled out of the tub. Pt states he fell two more times after that once he was out of the bath. Denies hitting his head. LOC of 1-2 seconds  2. ONSET: "When did the injury happen?" (Minutes or hours ago)     2 weeks ago 3. LOCATION: "Where on the chest is the injury located?"     L side - took brunt of fall on the L side 4. APPEARANCE: "What does the injury look like?"     No bruising 5. BLEEDING: "Is there any bleeding now? If Yes, ask: How long has it been bleeding?"     None 6. SEVERITY: "Any difficulty with breathing?"     Difficulty breathing (getting over the flu), congestion in the chest, "feel like I need to cough but the bruising/soreness of the ribs makes that difficult" 7. SIZE: For cuts, bruises, or swelling, ask: "How large is it?" (e.g., inches or centimeters)  No 8. PAIN: "Is there pain?" If Yes, ask: "How bad is the pain?"   (e.g., Scale 1-10; or mild, moderate, severe)     7-8/10 at night, 4/10 during the day  No CP. "Sometimes" has difficulty breathing at rest but he has to cough.  Answer Assessment - Initial Assessment Questions 1. RESPIRATORY STATUS: "Describe your breathing?" (e.g., wheezing, shortness of breath, unable to speak, severe coughing)      "Sometimes" has difficulty breathing with coughing 2. ONSET: "When did this breathing problem begin?"      2 weeks 3. PATTERN "Does the difficult breathing come and go, or has it been constant since it started?"      Comes and goes with coughing 4. SEVERITY: "How bad is your breathing?" (e.g., mild, moderate, severe)    - MILD: No SOB at  rest, mild SOB with walking, speaks normally in sentences, can lie down, no retractions, pulse < 100.    - MODERATE: SOB at rest, SOB with minimal exertion and prefers to sit, cannot lie down flat, speaks in phrases, mild retractions, audible wheezing, pulse 100-120.    - SEVERE: Very SOB at rest, speaks in single words, struggling to breathe, sitting hunched forward, retractions, pulse > 120      No SOB at rest without coughing 5. RECURRENT SYMPTOM: "Have you had difficulty breathing before?" If Yes, ask: "When was the last time?" and "What happened that time?"      No 6. CARDIAC HISTORY: "Do you have any history of heart disease?" (e.g., heart attack, angina, bypass surgery, angioplasty)      HTN and Afib 7. LUNG HISTORY: "Do you have any history of lung disease?"  (e.g., pulmonary embolus, asthma, emphysema)     Sleep apnea 8. CAUSE: "What do you think is causing the breathing problem?"      Rib pain from fall 9. OTHER SYMPTOMS: "Do you have any other symptoms? (e.g., dizziness, runny nose, cough, chest pain, fever)     L rib pain. Diff breathing with coughing. No CP. No fever. Coughing frequently (getting over the flu). Knots on legs have almost gone to zero with Xarelto. Knot on L knee is barely noticeable. No dizziness. Feels weak d/t pain at night. "Feels like my heart flutters once in a while", does not know if that is just his imagination (Afib). Denies N/V. No diaphoresis.  Protocols used: Chest Injury-A-AH, Breathing Difficulty-A-AH

## 2023-06-21 ENCOUNTER — Telehealth: Payer: Self-pay

## 2023-06-21 DIAGNOSIS — I82890 Acute embolism and thrombosis of other specified veins: Secondary | ICD-10-CM

## 2023-06-21 MED ORDER — RIVAROXABAN 20 MG PO TABS: 20.0000 mg | ORAL_TABLET | Freq: Every day | ORAL | 0 refills | Status: DC

## 2023-06-21 NOTE — Telephone Encounter (Signed)
 Patient advised with verbal understanding

## 2023-06-21 NOTE — Telephone Encounter (Signed)
 Rx sent for Xarelto 20 mg daily through 08/29/2023 sent today. Recent ER presentation 2/11, found to have superficial vein thrombosis in close proximity to SFJ. Will see for follow up in May.

## 2023-06-22 ENCOUNTER — Other Ambulatory Visit: Payer: Self-pay | Admitting: Internal Medicine

## 2023-06-22 MED ORDER — OLMESARTAN MEDOXOMIL 40 MG PO TABS
40.0000 mg | ORAL_TABLET | Freq: Every day | ORAL | 0 refills | Status: DC
Start: 2023-06-22 — End: 2023-08-16

## 2023-06-24 ENCOUNTER — Ambulatory Visit: Payer: Medicaid Other | Admitting: Urology

## 2023-07-13 ENCOUNTER — Encounter (HOSPITAL_COMMUNITY): Payer: Self-pay

## 2023-07-13 ENCOUNTER — Other Ambulatory Visit (HOSPITAL_COMMUNITY): Payer: Self-pay

## 2023-07-13 ENCOUNTER — Telehealth: Payer: Self-pay | Admitting: Pharmacy Technician

## 2023-07-13 ENCOUNTER — Telehealth: Payer: Self-pay

## 2023-07-13 NOTE — Telephone Encounter (Signed)
 Copied from CRM [id number]. Topic: Clinical - Medical Advice >> Jul 13, 2023 11:10 AM Almira Coaster wrote: Reason for CRM: Patient is experiencing hip and back pain and states his muscle relaxer that Dr.Dixon prescribed aren't helping and wanted to know if Dr.Dixon can prescribe something a bit stronger, advised that an appointment is required and scheduled an appointment for tomorrow with Dr.Dixon. He would like to know if he can send something today to help him get through the day.

## 2023-07-13 NOTE — Telephone Encounter (Signed)
 Pharmacy Patient Advocate Encounter  Received notification from [facility] that Prior Authorization for [facility] 2.4MG /0.75ML auto-injectors has been APPROVED from 07/13/2023 to 07/12/2024. Ran test claim, Copay is $4.00. This test claim was processed through [facility]- copay amounts may vary at other pharmacies due to pharmacy/plan contracts, or as the patient moves through the different stages of their insurance plan.   PA #/Case ID/Reference #: [id number]

## 2023-07-13 NOTE — Telephone Encounter (Signed)
 Pharmacy Patient Advocate Encounter   Received notification from Patient Pharmacy that prior authorization for [facility] 2.4MG /0.75ML auto-injectors is required/requested.   Insurance verification completed.   The patient is insured through [employer] .   Per test claim: PA required; PA submitted to above mentioned insurance via CoverMyMeds Key/confirmation #/EOC [id number] Status is pending

## 2023-07-14 ENCOUNTER — Ambulatory Visit (HOSPITAL_COMMUNITY)
Admission: RE | Admit: 2023-07-14 | Discharge: 2023-07-14 | Disposition: A | Discharge status: Home / Self Care | Source: Ambulatory Visit | Attending: Internal Medicine | Admitting: Internal Medicine

## 2023-07-14 ENCOUNTER — Other Ambulatory Visit (HOSPITAL_COMMUNITY): Payer: Self-pay

## 2023-07-14 ENCOUNTER — Encounter: Payer: Self-pay | Admitting: Internal Medicine

## 2023-07-14 ENCOUNTER — Ambulatory Visit: Payer: Self-pay | Admitting: Internal Medicine

## 2023-07-14 VITALS — BP 105/76 | HR 75 | Ht 67.5 in | Wt 257.8 lb

## 2023-07-14 DIAGNOSIS — M7918 Myalgia, other site: Secondary | ICD-10-CM | POA: Insufficient documentation

## 2023-07-14 MED ORDER — METHOCARBAMOL 500 MG PO TABS: 500.0000 mg | ORAL_TABLET | Freq: Four times a day (QID) | ORAL | 2 refills | Status: AC | PRN

## 2023-07-14 NOTE — Assessment & Plan Note (Signed)
 Evaluated today for an acute visit after falling backwards off of a ladder yesterday, landing on his buttocks.  Since that time he has experienced pain in his buttocks and low back.  On exam there is tenderness to palpation of the buttocks as well as the sacrum.  ROM intact.  No red flag symptoms identified.  He is attempting to manage pain with Robaxin. -Treatment options reviewed.  Recommend Tylenol and Robaxin for pain control.  Will obtain x-rays of the sacrum and coccyx to rule out fracture.  He was instructed to return to care if symptoms worsen or fail to improve.  Otherwise, he is scheduled for routine follow-up in May.

## 2023-07-14 NOTE — Progress Notes (Signed)
   Acute Office Visit  Subjective:     Patient ID: Eddie Marshall, male    DOB: 04/18/1981, 43 y.o.   MRN: [id number]  Chief Complaint  Patient presents with   Fall    Patient fell yesterday ,reporting back and hip pain    [name] has been evaluated today for acute visit in the setting of a fall yesterday.  He works at [employer] and missed the bottom step on a step ladder, falling backwards onto his buttocks.  Since that time he has experienced pain in his buttocks.  Pain is worse with flexion at the waist and attempting to lift heavy objects.  He denies numbness/weakness in his lower extremities.  No bowel/bladder incontinence.  He has tried taking Robaxin without significant pain relief.  Review of Systems  Musculoskeletal:  Positive for back pain (Lumbar spine, bilateral buttocks).  All other systems reviewed and are negative.     Objective:    BP 105/76 (BP Location: Left Arm, Patient Position: Sitting, Cuff Size: Large)   Pulse 75   Ht 5' 7.5" (1.715 m)   Wt 257 lb 12.8 oz (116.9 kg)   SpO2 97%   BMI 39.78 kg/m   Physical Exam Vitals reviewed.  Constitutional:      Appearance: Normal appearance. He is obese.  Musculoskeletal:        General: Normal range of motion.     Comments: No obvious deformity on inspection of the lumbar spine and buttocks.  There is tenderness to palpation in the right and left buttocks as well as over the sacrum.  ROM at the waist is generally intact.  The lower extremities are neurovascularly intact.  Strength intact in lower extremities as well.  Neurological:     Mental Status: He is alert.       Assessment & Plan:   Problem List Items Addressed This Visit       Bilateral buttock pain - Primary   Evaluated today for an acute visit after falling backwards off of a ladder yesterday, landing on his buttocks.  Since that time he has experienced pain in his buttocks and low back.  On exam there is tenderness to palpation of the buttocks as well  as the sacrum.  ROM intact.  No red flag symptoms identified.  He is attempting to manage pain with Robaxin. -Treatment options reviewed.  Recommend Tylenol and Robaxin for pain control.  Will obtain x-rays of the sacrum and coccyx to rule out fracture.  He was instructed to return to care if symptoms worsen or fail to improve.  Otherwise, he is scheduled for routine follow-up in May.       Meds ordered this encounter  Medications   methocarbamol (ROBAXIN) 500 MG tablet    Sig: Take 1 tablet (500 mg total) by mouth every 6 (six) hours as needed for muscle spasms.    Dispense:  30 tablet    Refill:  2    Return if symptoms worsen or fail to improve.  Billie Lade, MD

## 2023-07-14 NOTE — Patient Instructions (Signed)
 It was a pleasure to see you today.  Thank you for giving [place] the opportunity to be involved in your care.  Below is a brief recap of your visit and next steps.  We will plan to see you again on 5/20.  Summary Xrays ordered today Use tylenol / Robaxin Follow up as scheduled in May

## 2023-07-18 NOTE — Progress Notes (Unsigned)
 Name: Eddie Marshall DOB: 1981-03-22 MRN: [id number]  History of Present Illness: Mr. Eddie Marshall is a 43 y.o. male who presents today for follow up visit at [facility] [place].  GU History includes: 1. Urethral stricture disease.  - Dilated in 2017 by Dr. Annabell Howells at [facility].   Recent history: > 05/19/2023 - 05/23/2023: Admitted for suspected urosepsis (however urine culture turned out negative). On 05/20/2023 Dr. Ronne Binning performed cystoscopy with urethral dilation and placement of a 16 french Foley catheter due to a dense bulbar urethral stricture with urinary retention (PVR > 1000 ml).  > 05/27/2023: Urology nurse visit; passed voiding trial.  > 06/13/2023: Urology nurse visit; PVR = 29 ml.  Today: He denies urinary urgency, frequency, nocturia, dysuria, gross hematuria, weak urinary stream, hesitancy, straining to void, or sensations of incomplete emptying.   Medications: Current Outpatient Medications  Medication Sig Dispense Refill   acetaminophen (TYLENOL) 325 MG tablet Take 2 tablets (650 mg total) by mouth every 6 (six) hours as needed for mild pain (pain score 1-3) (or Fever >/= 101).     albuterol (VENTOLIN HFA) 108 (90 Base) MCG/ACT inhaler Inhale 2 puffs into the lungs every 6 (six) hours as needed for wheezing or shortness of breath. 8 g 2   aspirin EC 81 MG tablet Take 81 mg by mouth daily.     methocarbamol (ROBAXIN) 500 MG tablet Take 1 tablet (500 mg total) by mouth every 6 (six) hours as needed for muscle spasms. 30 tablet 2   olmesartan (BENICAR) 40 MG tablet Take 1 tablet (40 mg total) by mouth daily. 90 tablet 0   promethazine-dextromethorphan (PROMETHAZINE-DM) 6.25-15 MG/5ML syrup Take 5 mLs by mouth 4 (four) times daily as needed for cough. 118 mL 0   rivaroxaban (XARELTO) 20 MG TABS tablet Take 1 tablet (20 mg total) by mouth daily with supper for 70 doses. 70 tablet 0   Semaglutide-Weight Management 2.4 MG/0.75ML SOAJ Inject 2.4 mg into the skin once a  week. 3 mL 2   No current facility-administered medications for this visit.    Allergies: Allergies  Allergen Reactions   Other Anaphylaxis    Epidural anesthesia    Past Medical History:  Diagnosis Date   Arthritis    Complication of anesthesia    Pt had an epidural that went into his lungs   Hip disease    Hypertension    Seizures (HCC)    Past Surgical History:  Procedure Laterality Date   CYSTOSCOPY WITH RETROGRADE URETHROGRAM N/A 02/06/2016   Procedure: CYSTOSCOPY WITH RETROGRADE URETHROGRAM;  Surgeon: Bjorn Pippin, MD;  Location: [facility];  Service: Urology;  Laterality: N/A;   CYSTOSCOPY WITH URETHRAL DILATATION N/A 02/06/2016   Procedure: CYSTOSCOPY WITH URETHRAL DILATATION;  Surgeon: Bjorn Pippin, MD;  Location: [facility];  Service: Urology;  Laterality: N/A;   HIP SURGERY Left 1993 1995 1996   x 3   KNEE SURGERY Right 1999   Family History  Problem Relation Age of Onset   Diabetes Mother    Hypertension Mother    Hypertension Father    Social History   Socioeconomic History   Marital status: Single    Spouse name: Not on file   Number of children: Not on file   Years of education: Not on file   Highest education level: Not on file  Occupational History   Not on file  Tobacco Use   Smoking status: Never   Smokeless tobacco: Never  Vaping Use   Vaping  status: Never Used  Substance and Sexual Activity   Alcohol use: No   Drug use: No   Sexual activity: Yes  Other Topics Concern   Not on file  Social History Narrative   Not on file   Social Drivers of Health   Financial Resource Strain: Not on file  Food Insecurity: No Food Insecurity (05/25/2023)   Hunger Vital Sign    Worried About Running Out of Food in the Last Year: Never true    Ran Out of Food in the Last Year: Never true  Transportation Needs: No Transportation Needs (05/25/2023)   PRAPARE - Administrator, Civil Service (Medical): No    Lack of Transportation (Non-Medical): No   Physical Activity: Not on file  Stress: Not on file  Social Connections: Not on file  Intimate Partner Violence: Not At Risk (05/25/2023)   Humiliation, Afraid, Rape, and Kick questionnaire    Fear of Current or Ex-Partner: No    Emotionally Abused: No    Physically Abused: No    Sexually Abused: No    Review of Systems Constitutional: Patient denies any unintentional weight loss or change in strength lntegumentary: Patient denies any rashes or pruritus Cardiovascular: Patient denies chest pain or syncope Respiratory: Patient denies shortness of breath Musculoskeletal: Patient denies muscle cramps or weakness Neurologic: Patient denies convulsions or seizures Allergic/Immunologic: Patient denies recent allergic reaction(s) Hematologic/Lymphatic: Patient denies bleeding tendencies Endocrine: Patient denies heat/cold intolerance  GU: As per HPI.  OBJECTIVE Vitals:   07/19/23 1111  BP: 132/86  Pulse: 85   There is no height or weight on file to calculate BMI.  Physical Examination Constitutional: No obvious distress; patient is non-toxic appearing  Cardiovascular: No visible lower extremity edema.  Respiratory: The patient does not have audible wheezing/stridor; respirations do not appear labored  Gastrointestinal: Abdomen non-distended Musculoskeletal: Normal ROM of UEs  Skin: No obvious rashes/open sores  Neurologic: CN 2-12 grossly intact Psychiatric: Answered questions appropriately with normal affect  Hematologic/Lymphatic/Immunologic: No obvious bruises or sites of spontaneous bleeding  Urine microscopy: 6-10 WBC/hpf, 0-2 RBC/hpf, 1+ bacteria PVR: 74 ml  ASSESSMENT Post-traumatic bulbous urethral stricture - Plan: BLADDER SCAN AMB NON-IMAGING, Urinalysis, Routine w reflex microscopic  Hospital discharge follow-up - Plan: BLADDER SCAN AMB NON-IMAGING, Urinalysis, Routine w reflex microscopic  He is doing well at this time with no acute concerns. We discussed  option to do clean intermittent catheterization (CIC) at home 1x/week to minimize urethral stricture recurrence risk; he declined. We agreed to plan for follow up in 6 months or sooner if needed. Patient verbalized understanding of and agreement with current plan. All questions were answered.  PLAN Advised the following: 1. Return in about 6 months (around 01/18/2024) for UA, uroflow, PVR, & f/u with Evette Georges NP.  Orders Placed This Encounter  Procedures   Urinalysis, Routine w reflex microscopic   BLADDER SCAN AMB NON-IMAGING    It has been explained that the patient is to follow regularly with their PCP in addition to all other providers involved in their care and to follow instructions provided by these respective offices. Patient advised to contact urology clinic if any urologic-pertaining questions, concerns, new symptoms or problems arise in the interim period.  There are no Patient Instructions on file for this visit.  Electronically signed by:  Donnita Falls, FNP   07/19/23    11:30 AM

## 2023-07-19 ENCOUNTER — Ambulatory Visit: Admitting: Urology

## 2023-07-19 ENCOUNTER — Encounter: Payer: Self-pay | Admitting: Urology

## 2023-07-19 VITALS — BP 132/86 | HR 85

## 2023-07-19 DIAGNOSIS — Z09 Encounter for follow-up examination after completed treatment for conditions other than malignant neoplasm: Secondary | ICD-10-CM

## 2023-07-19 DIAGNOSIS — N35011 Post-traumatic bulbous urethral stricture: Secondary | ICD-10-CM

## 2023-07-19 LAB — URINALYSIS, ROUTINE W REFLEX MICROSCOPIC
Bilirubin, UA: NEGATIVE
Glucose, UA: NEGATIVE
Ketones, UA: NEGATIVE
Nitrite, UA: NEGATIVE
RBC, UA: NEGATIVE
Specific Gravity, UA: 1.025 (ref 1.005–1.030)
Urobilinogen, Ur: 1 mg/dL (ref 0.2–1.0)
pH, UA: 6 (ref 5.0–7.5)

## 2023-07-19 LAB — MICROSCOPIC EXAMINATION

## 2023-07-19 LAB — BLADDER SCAN AMB NON-IMAGING: Scan Result: 74

## 2023-07-21 ENCOUNTER — Other Ambulatory Visit (HOSPITAL_COMMUNITY): Payer: Self-pay

## 2023-07-22 ENCOUNTER — Encounter: Payer: Self-pay | Admitting: Internal Medicine

## 2023-08-16 ENCOUNTER — Other Ambulatory Visit (HOSPITAL_COMMUNITY): Payer: Self-pay

## 2023-08-16 ENCOUNTER — Other Ambulatory Visit: Payer: Self-pay | Admitting: Internal Medicine

## 2023-08-16 MED ORDER — OLMESARTAN MEDOXOMIL 40 MG PO TABS: 40.0000 mg | ORAL_TABLET | Freq: Every day | ORAL | 0 refills | Status: DC

## 2023-08-16 MED ORDER — SEMAGLUTIDE-WEIGHT MANAGEMENT 2.4 MG/0.75ML ~~LOC~~ SOAJ
2.4000 mg | SUBCUTANEOUS | 2 refills | Status: DC
Start: 2023-08-16 — End: 2023-09-13
  Filled 2023-08-16: qty 3, 28d supply, fill #0

## 2023-08-16 NOTE — Telephone Encounter (Signed)
 Copied from CRM [id number]. Topic: Clinical - Medication Refill >> Aug 16, 2023  8:07 AM Rosamond Comes wrote: Patient calling in requesting medication refill   Most Recent Primary Care Visit:  Provider: DIXON, PHILLIP E  Department: [facility]  Visit Type: ACUTE  Date: 07/14/2023  Medication:  Semaglutide -Weight Management 2.4 MG/0.75ML SOAJ    This medication goes to this Pharmacy [facility] [address] [place] [address] Phone: [phone] Fax: [phone]   olmesartan  (BENICAR ) 40 MG tablet  This medication goes to the Pharmacy [facility] [id number] - [place], [place] - [address] AT [facility] OF [address] [place] [address] Phone: [phone] Fax: [phone]     Has the patient contacted their pharmacy? No, tried going through [employer], didn't work.    Is this the correct pharmacy for this prescription? Yes    Has the prescription been filled recently? Yes  Is the patient out of the medication? Yes for Semaglutide  and has 5 days left of olmesartan   Has the patient been seen for an appointment in the last year OR does the patient have an upcoming appointment? Yes  Can we respond through MyChart? No  Agent: Please be advised that Rx refills may take up to 3 business days. We ask that you follow-up with your pharmacy.

## 2023-08-23 ENCOUNTER — Other Ambulatory Visit (HOSPITAL_COMMUNITY): Payer: Self-pay

## 2023-08-25 ENCOUNTER — Ambulatory Visit: Payer: Self-pay

## 2023-08-25 NOTE — Telephone Encounter (Addendum)
 Chief Complaint: leg pain Symptoms: leg pain, discoloration; hx of DVT Frequency: few days Pertinent Negatives: Patient denies CP, SOB, palpitations, heat, redness Disposition: [] ED /[x] Urgent Care (no appt availability in office) / [] Appointment(In office/virtual)/ []  [facility]/ [] Home Care/ [x] Refused Recommended Disposition /[] [facility]/ []  Follow-up with PCP Additional Notes: Pt reports area of discomfort and discoloration to the medial side of his L knee in the area he was previously diagnosed with a DVT. 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 Pt reports 1/10 discomfort there with a bruised discoloration. Pt states "it looks like I bumped my leg into something." Pt denies redness, denies heat at this time, and states the swelling is "not that bad." RN advised pt since he has a hx of DVT in this location and his symptoms are returning, he needs to see a HCP within 4 hours and advised UC. Pt declined, stating he is not able to drive there today because he and his sister are sharing a vehicle and she needs the vehicle at 1600 this afternoon. No availability in the office today. RN advised pt RN would relay concern to the office for follow-up. Pt states his DVT was diagnosed at [facility]. RN advised pt return to [facility] if he is able to secure transportation since [facility] has more capability to diagnose and treat DVT. RN also advised pt he should call 911 if he develops CP, SOB, palpitations. Pt verbalized understanding.   Please follow-up with the pt as they are unlikely to seek care today d/t lack of transport.  RN called CAL as well, CAL on lunch.    Copied from CRM [id number]. Topic: Clinical - Red Word Triage >> Aug 25, 2023 12:24 PM Marissa P wrote: Red Word that prompted transfer to Nurse Triage: Noticed some pain with bloodclot left leg and right above knee. Reason for Disposition  History of prior "blood clot" in leg or lungs (i.e., deep vein  thrombosis, pulmonary embolism)  Answer Assessment - Initial Assessment Questions 1. ONSET: "When did the pain start?"      L leg - small amount of discomfort "reminding me it's there", inside on L knee, looks like a bruise 2. LOCATION: "Where is the pain located?"      L leg medial knee 3. PAIN: "How bad is the pain?"    (Scale 1-10; or mild, moderate, severe)   -  MILD (1-3): doesn't interfere with normal activities    -  MODERATE (4-7): interferes with normal activities (e.g., work or school) or awakens from sleep, limping    -  SEVERE (8-10): excruciating pain, unable to do any normal activities, unable to walk     "When I noticed it was bothering me, maybe a 2/10, right now a 1/10" 4. WORK OR EXERCISE: "Has there been any recent work or exercise that involved this part of the body?"      No  5. CAUSE: "What do you think is causing the leg pain?"     DVT - hx of same  6. OTHER SYMPTOMS: "Do you have any other symptoms?" (e.g., chest pain, back pain, breathing difficulty, swelling, rash, fever, numbness, weakness)     Takes Xarelto  for DVT. "Looks like you maybe bumped your leg into something." Denies CP and SOB. Endorses numbness and tingling when he sits for too long. Denies heat "right now." Today the swelling "is not that bad."  Protocols used: Leg Pain-A-AH

## 2023-08-26 NOTE — Telephone Encounter (Signed)
 Spoke to patient , his leg is doing a lot better, no swelling. Patient advised to go to ER or urgent care if he has concerns

## 2023-09-06 ENCOUNTER — Ambulatory Visit: Payer: Medicaid Other | Admitting: Internal Medicine

## 2023-09-06 ENCOUNTER — Encounter: Payer: Self-pay | Admitting: Internal Medicine

## 2023-09-06 DIAGNOSIS — I82890 Acute embolism and thrombosis of other specified veins: Secondary | ICD-10-CM

## 2023-09-06 DIAGNOSIS — G44209 Tension-type headache, unspecified, not intractable: Secondary | ICD-10-CM

## 2023-09-06 DIAGNOSIS — I10 Essential (primary) hypertension: Secondary | ICD-10-CM | POA: Diagnosis not present

## 2023-09-06 DIAGNOSIS — G8929 Other chronic pain: Secondary | ICD-10-CM

## 2023-09-06 DIAGNOSIS — M545 Low back pain, unspecified: Secondary | ICD-10-CM | POA: Diagnosis not present

## 2023-09-06 MED ORDER — LIDOCAINE 5 % EX PTCH: 1.0000 | MEDICATED_PATCH | CUTANEOUS | 0 refills | Status: AC

## 2023-09-06 NOTE — Progress Notes (Signed)
 Established Patient Office Visit  Subjective   Patient ID: Eddie Marshall, male    DOB: [birth year]  Age: 43 y.o. MRN: [id number]  Chief Complaint  Patient presents with   Care Management    Three month follow up    Headache    Random headache with dizziness and sharp pain in the shoulders , will come and go , usually starts in the morning and will last 30 mins to an hour   Cyst    Knot on left leg    Mr. Budlong returns to care today for routine follow-up.  He was last evaluated by me 3/27 for an acute visit in the setting of bilateral buttock pain after falling off of a ladder.  X-rays of the sacrum and coccyx were obtained and were negative for fracture.  Conservative treatment measures recommended.  Previously seen by me for routine follow-up in November 2024.  He was referred to vascular surgery at that time given recurring superficial vein thromboses.  55-month follow-up was arranged for reassessment.  He established care with vascular surgery in the interim.  Hospital admission 1/30 - 2/3 in the setting of sepsis secondary to gram-negative UTI.  Seen at [facility] for hospital follow-up on 2/14.  Most recently, he was seen by urology for follow-up on 4/1.  Mr. Haack reports feeling fairly well today.  His acute concern is frontal tension headaches that have increased in frequency.  He denies photophobia and phonophobia.  He also notes a recurring knot along the medial aspect of his left thigh.  Past Medical History:  Diagnosis Date   Arthritis    Complication of anesthesia    Pt had an epidural that went into his lungs   Hip disease    Hypertension    Seizures (HCC)    Sepsis due to gram-negative UTI (HCC) 05/19/2023   Past Surgical History:  Procedure Laterality Date   CYSTOSCOPY WITH RETROGRADE URETHROGRAM N/A 02/06/2016   Procedure: CYSTOSCOPY WITH RETROGRADE URETHROGRAM;  Surgeon: Homero Luster, MD;  Location: [facility];  Service: Urology;  Laterality: N/A;   CYSTOSCOPY WITH URETHRAL  DILATATION N/A 02/06/2016   Procedure: CYSTOSCOPY WITH URETHRAL DILATATION;  Surgeon: Homero Luster, MD;  Location: [facility];  Service: Urology;  Laterality: N/A;   HIP SURGERY Left 1993 1995 1996   x 3   KNEE SURGERY Right 1999   Social History   Tobacco Use   Smoking status: Never   Smokeless tobacco: Never  Vaping Use   Vaping status: Never Used  Substance Use Topics   Alcohol use: No   Drug use: No   Family History  Problem Relation Age of Onset   Diabetes Mother    Hypertension Mother    Hypertension Father    Allergies  Allergen Reactions   Other Anaphylaxis    Epidural anesthesia   Review of Systems  Constitutional:  Negative for chills and fever.  HENT:  Negative for sore throat.   Respiratory:  Negative for cough and shortness of breath.   Cardiovascular:  Negative for chest pain, palpitations and leg swelling.  Gastrointestinal:  Negative for abdominal pain, blood in stool, constipation, diarrhea, nausea and vomiting.  Genitourinary:  Negative for dysuria and hematuria.  Musculoskeletal:  Negative for myalgias.  Skin:  Negative for itching and rash.       Knot on left medial thigh  Neurological:  Positive for headaches. Negative for dizziness.  Psychiatric/Behavioral:  Negative for depression and suicidal ideas.  Objective:     BP 110/74   Pulse 76   Ht 5' 7.5" (1.715 m)   Wt 249 lb 9.6 oz (113.2 kg)   SpO2 98%   BMI 38.52 kg/m  BP Readings from Last 3 Encounters:  09/06/23 110/74  07/19/23 132/86  07/14/23 105/76   Physical Exam Vitals reviewed.  Constitutional:      General: He is not in acute distress.    Appearance: Normal appearance. He is obese. He is not ill-appearing.  HENT:     Head: Normocephalic and atraumatic.     Right Ear: External ear normal.     Left Ear: External ear normal.     Nose: Nose normal. No congestion or rhinorrhea.     Mouth/Throat:     Mouth: Mucous membranes are moist.     Pharynx: Oropharynx is clear.  Eyes:      General: No scleral icterus.    Extraocular Movements: Extraocular movements intact.     Conjunctiva/sclera: Conjunctivae normal.     Pupils: Pupils are equal, round, and reactive to light.  Cardiovascular:     Rate and Rhythm: Normal rate and regular rhythm.     Pulses: Normal pulses.     Heart sounds: Normal heart sounds. No murmur heard. Pulmonary:     Effort: Pulmonary effort is normal.     Breath sounds: Normal breath sounds. No wheezing, rhonchi or rales.  Abdominal:     General: Abdomen is flat. Bowel sounds are normal. There is no distension.     Palpations: Abdomen is soft.     Tenderness: There is no abdominal tenderness.  Musculoskeletal:        General: No swelling or deformity. Normal range of motion.     Cervical back: Normal range of motion.  Skin:    General: Skin is warm and dry.     Capillary Refill: Capillary refill takes less than 2 seconds.     Findings: Lesion (Palpable, firm, nontender lesion on left medial thigh with no surrounding erythema.) present.  Neurological:     General: No focal deficit present.     Mental Status: He is alert and oriented to person, place, and time.     Motor: No weakness.  Psychiatric:        Mood and Affect: Mood normal.        Behavior: Behavior normal.        Thought Content: Thought content normal.   Last CBC Lab Results  Component Value Date   WBC 5.1 06/03/2023   HGB 13.8 06/03/2023   HCT 42.2 06/03/2023   MCV 92 06/03/2023   MCH 29.9 06/03/2023   RDW 12.8 06/03/2023   PLT 228 06/03/2023   Last metabolic panel Lab Results  Component Value Date   GLUCOSE 87 06/03/2023   NA 139 06/03/2023   K 4.0 06/03/2023   CL 101 06/03/2023   CO2 27 06/03/2023   BUN 8 06/03/2023   CREATININE 0.70 (L) 06/03/2023   EGFR 116 06/03/2023   CALCIUM 9.2 06/03/2023   PROT 7.2 09/16/2022   ALBUMIN 4.4 09/16/2022   LABGLOB 2.8 09/16/2022   AGRATIO 1.6 09/16/2022   BILITOT 1.4 (H) 09/16/2022   ALKPHOS 57 09/16/2022   AST  21 09/16/2022   ALT 23 09/16/2022   ANIONGAP 6 05/21/2023   Last lipids Lab Results  Component Value Date   CHOL 123 08/16/2022   HDL 38 (L) 08/16/2022   LDLCALC 72 08/16/2022   TRIG 59 08/16/2022  CHOLHDL 3.2 08/16/2022   Last hemoglobin A1c Lab Results  Component Value Date   HGBA1C 5.6 08/16/2022   Last thyroid functions Lab Results  Component Value Date   TSH 2.430 08/16/2022   Last vitamin D  Lab Results  Component Value Date   VD25OH 31.3 12/13/2022   Last vitamin B12 and Folate Lab Results  Component Value Date   VITAMINB12 322 08/16/2022   FOLATE 8.3 08/16/2022     Assessment & Plan:   Problem List Items Addressed This Visit       Essential hypertension   Remains adequately controlled on current antihypertensive regimen.  No medication changes are indicated today.      Superficial vein thrombosis   His acute concern today is a knot that has developed on the left medial thigh.  He has a known history of recurring superficial thrombophlebitis that has been attributed to chronic venous insufficiency and obesity.  He was previously referred to vascular surgery and was evaluated 1/7.  ASA 81 mg daily indefinitely was recommended in addition to compression stockings, leg elevation, and regular exercise in an effort to prevent recurrence.  He is currently prescribed Xarelto , which he should complete soon.  I recommended starting ASA 81 mg daily upon completion of Xarelto  and reviewed additional conservative treatment measures previously recommended by vascular surgery.      Morbid obesity (HCC) - Primary   Current weight 249 pounds.  BMI 38.5.  He has lost 34 pounds over the last 6 months.  He is currently prescribed Wegovy  2.4 mg weekly and is focused on healthy dieting habits and exercising regularly in an effort to continue losing weight.  He was congratulated on his progress today.      Tension headache   His additional concern today is tension headaches that  have increased in frequency recently.  No change in pattern or intensity.  He denies associated photophobia/phonophobia.  Conservative treatment measures recommended and he was provided with information at the conclusion of today's appointment regarding management of tension headaches.      Chronic bilateral low back pain without sciatica   Chronic issue.  No red flag symptoms identified.  Lidocaine  patches added today at his request.      Return in about 3 months (around 12/07/2023).   Tobi Fortes, MD

## 2023-09-06 NOTE — Patient Instructions (Signed)
 It was a pleasure to see you today.  Thank you for giving us  the opportunity to be involved in your care.  Below is a brief recap of your visit and next steps.  We will plan to see you again in 3 months.  Summary No medication changes today See treatment recommendations for tension headaches Repeat labs Follow up in 3 months

## 2023-09-13 ENCOUNTER — Other Ambulatory Visit: Payer: Self-pay | Admitting: Internal Medicine

## 2023-09-13 ENCOUNTER — Other Ambulatory Visit (HOSPITAL_COMMUNITY): Payer: Self-pay

## 2023-09-13 MED ORDER — SEMAGLUTIDE-WEIGHT MANAGEMENT 2.4 MG/0.75ML ~~LOC~~ SOAJ
2.4000 mg | SUBCUTANEOUS | 2 refills | Status: DC
  Filled 2023-09-13: qty 3, 28d supply, fill #0
  Filled 2023-10-13: qty 3, 28d supply, fill #1
  Filled 2023-11-15: qty 3, 28d supply, fill #2

## 2023-09-13 NOTE — Telephone Encounter (Signed)
 Last Fill: 08/16/23  Last OV: 09/06/23  Next OV: 12/07/23  Routing to provider for review/authorization.

## 2023-09-13 NOTE — Telephone Encounter (Signed)
 Copied from CRM [id number]. Topic: Clinical - Medication Refill >> Sep 13, 2023  7:33 AM Essie A wrote: Medication: Semaglutide -Weight Management 2.4 MG/0.75ML SOAJ  Has the patient contacted their pharmacy? No  Did not have the phone number for pharmacy (Agent: If no, request that the patient contact the pharmacy for the refill. If patient does not wish to contact the pharmacy document the reason why and proceed with request.) (Agent: If yes, when and what did the pharmacy advise?)  This is the patient's preferred pharmacy:  [place] [facility] [address] [place] [address] Phone: [phone] Fax: [phone]  Is this the correct pharmacy for this prescription? Yes If no, delete pharmacy and type the correct one.   Has the prescription been filled recently? Yes  Is the patient out of the medication? Yes  Has the patient been seen for an appointment in the last year OR does the patient have an upcoming appointment? Yes  Can we respond through MyChart? No  Agent: Please be advised that Rx refills may take up to 3 business days. We ask that you follow-up with your pharmacy.

## 2023-09-16 ENCOUNTER — Other Ambulatory Visit (HOSPITAL_COMMUNITY): Payer: Self-pay

## 2023-09-16 ENCOUNTER — Telehealth: Payer: Self-pay | Admitting: Pharmacy Technician

## 2023-09-16 NOTE — Telephone Encounter (Signed)
 Pharmacy Patient Advocate Encounter   Received notification from CoverMyMeds that prior authorization for Lidocaine  5% patches is required/requested.   Insurance verification completed.   The patient is insured through [facility] .   Per test claim: PA required; PA submitted to above mentioned insurance via CoverMyMeds Key/confirmation #/EOC [id number] Status is pending

## 2023-09-16 NOTE — Telephone Encounter (Signed)
 Pharmacy Patient Advocate Encounter  Received notification from [facility] that Prior Authorization for Lidocaine  5% patches has been APPROVED from 09/16/2023 to 09/15/2024. Ran test claim, Copay is $4.00. This test claim was processed through [facility]- copay amounts may vary at other pharmacies due to pharmacy/plan contracts, or as the patient moves through the different stages of their insurance plan.   PA #/Case ID/Reference #: [id number]

## 2023-09-22 DIAGNOSIS — G44209 Tension-type headache, unspecified, not intractable: Secondary | ICD-10-CM | POA: Insufficient documentation

## 2023-09-22 DIAGNOSIS — G8929 Other chronic pain: Secondary | ICD-10-CM | POA: Insufficient documentation

## 2023-09-22 NOTE — Assessment & Plan Note (Signed)
 Remains adequately controlled on current antihypertensive regimen.  No medication changes are indicated today.

## 2023-09-22 NOTE — Assessment & Plan Note (Signed)
 His additional concern today is tension headaches that have increased in frequency recently.  No change in pattern or intensity.  He denies associated photophobia/phonophobia.  Conservative treatment measures recommended and he was provided with information at the conclusion of today's appointment regarding management of tension headaches.

## 2023-09-22 NOTE — Assessment & Plan Note (Addendum)
 His acute concern today is a knot that has developed on the left medial thigh.  He has a known history of recurring superficial thrombophlebitis that has been attributed to chronic venous insufficiency and obesity.  He was previously referred to vascular surgery and was evaluated 1/7.  ASA 81 mg daily indefinitely was recommended in addition to compression stockings, leg elevation, and regular exercise in an effort to prevent recurrence.  He is currently prescribed Xarelto , which he should complete soon.  I recommended starting ASA 81 mg daily upon completion of Xarelto  and reviewed additional conservative treatment measures previously recommended by vascular surgery.

## 2023-09-22 NOTE — Assessment & Plan Note (Signed)
 Chronic issue.  No red flag symptoms identified.  Lidocaine  patches added today at his request.

## 2023-09-22 NOTE — Assessment & Plan Note (Signed)
 Current weight 249 pounds.  BMI 38.5.  He has lost 34 pounds over the last 6 months.  He is currently prescribed Wegovy  2.4 mg weekly and is focused on healthy dieting habits and exercising regularly in an effort to continue losing weight.  He was congratulated on his progress today.

## 2023-10-13 ENCOUNTER — Other Ambulatory Visit (HOSPITAL_COMMUNITY): Payer: Self-pay

## 2023-10-24 ENCOUNTER — Ambulatory Visit: Payer: Self-pay

## 2023-10-24 NOTE — Telephone Encounter (Signed)
 FYI Only or Action Required?: FYI only for provider.  Patient was last seen in primary care on 09/06/2023 by Melvenia Manus BRAVO, MD. Called Nurse Triage reporting DVT. Symptoms began a week ago. Symptoms are: gradually worsening.  Triage Disposition: Go to ED or PCP/Alternative with Approval  Patient/caregiver understands and will follow disposition?: Yes                         Copied from CRM [id number]. Topic: Clinical - Red Word Triage >> Oct 24, 2023  8:45 AM Charlet HERO wrote: Red Word that prompted transfer to Nurse Triage: Patient is calling about blood clots in his legs he is stating that it went away and he had a script for the clots and they went away. He states that they have come back and his leg is hurting now with bump spots on his leg. He wants to know if he should go back to the [facility] doctor. Reason for Disposition  Patient sounds very sick or weak to the triager  Answer Assessment - Initial Assessment Questions Pt states he has a history of blood clots and was given medication that made them go away. Pt states he went to the hospital once to get it checked out. Pt was put on xarelto . Pt states Dr. Melvenia was put on another prescription for xarelto  and told that once they run out to take a baby aspirin. Pt states the blood clot on the right leg went away completely. Pt states yesterday the left leg was very bumpy and painful. Pt states he almost went to the hospital but decided to wait one more day to see if its recommended to go to ED or vascular office.   This RN recommends pt goes to ED and has another adult drive. Pt states he probably won't go today but will go one day this week. This RN emphasized importance of going today. Pt states understanding.    ONSET: When did the swelling start? (e.g., minutes, hours, days)     Last week, Monday LOCATION: What part of the leg is swollen?  Are both legs swollen or just one leg?     Left leg inside close  to the knee, bumpy (one knot), bruised, warm to touch intermittent SEVERITY: How bad is the swelling? (e.g., localized; mild, moderate, severe)   - Localized: Small area of swelling localized to one leg.   - MILD pedal edema: Swelling limited to foot and ankle, pitting edema < 1/4 inch (6 mm) deep, rest and elevation eliminate most or all swelling.   - MODERATE edema: Swelling of lower leg to knee, pitting edema > 1/4 inch (6 mm) deep, rest and elevation only partially reduce swelling.   - SEVERE edema: Swelling extends above knee, facial or hand swelling present.      Mild to moderate REDNESS: Does the swelling look red or infected?     No PAIN: Is the swelling painful to touch? If Yes, ask: How painful is it?   (Scale 1-10; mild, moderate or severe)     Right now 4-5/10 pain level FEVER: Do you have a fever? If Yes, ask: What is it, how was it measured, and when did it start?      No CAUSE: What do you think is causing the leg swelling?     Blood clot  MEDICAL HISTORY: Do you have a history of blood clots (e.g., DVT), cancer, heart failure, kidney disease, or liver failure?  DVT OTHER SYMPTOMS: Do you have any other symptoms? (e.g., chest pain, difficulty breathing)       Denies  Protocols used: Leg Swelling and Edema-A-AH

## 2023-10-29 ENCOUNTER — Emergency Department (HOSPITAL_BASED_OUTPATIENT_CLINIC_OR_DEPARTMENT_OTHER): Admitting: Radiology

## 2023-10-29 ENCOUNTER — Emergency Department (HOSPITAL_BASED_OUTPATIENT_CLINIC_OR_DEPARTMENT_OTHER)

## 2023-10-29 ENCOUNTER — Emergency Department (HOSPITAL_BASED_OUTPATIENT_CLINIC_OR_DEPARTMENT_OTHER): Admission: EM | Admit: 2023-10-29 | Discharge: 2023-10-29 | Disposition: A | Discharge status: Home / Self Care

## 2023-10-29 ENCOUNTER — Other Ambulatory Visit: Payer: Self-pay

## 2023-10-29 ENCOUNTER — Other Ambulatory Visit (HOSPITAL_BASED_OUTPATIENT_CLINIC_OR_DEPARTMENT_OTHER): Payer: Self-pay

## 2023-10-29 ENCOUNTER — Encounter (HOSPITAL_BASED_OUTPATIENT_CLINIC_OR_DEPARTMENT_OTHER): Payer: Self-pay | Admitting: Emergency Medicine

## 2023-10-29 DIAGNOSIS — M62830 Muscle spasm of back: Secondary | ICD-10-CM | POA: Diagnosis not present

## 2023-10-29 DIAGNOSIS — I8002 Phlebitis and thrombophlebitis of superficial vessels of left lower extremity: Secondary | ICD-10-CM | POA: Insufficient documentation

## 2023-10-29 DIAGNOSIS — I4891 Unspecified atrial fibrillation: Secondary | ICD-10-CM | POA: Diagnosis not present

## 2023-10-29 DIAGNOSIS — Z7982 Long term (current) use of aspirin: Secondary | ICD-10-CM | POA: Insufficient documentation

## 2023-10-29 DIAGNOSIS — M7989 Other specified soft tissue disorders: Secondary | ICD-10-CM | POA: Diagnosis present

## 2023-10-29 DIAGNOSIS — Z7901 Long term (current) use of anticoagulants: Secondary | ICD-10-CM | POA: Insufficient documentation

## 2023-10-29 HISTORY — DX: Acute embolism and thrombosis of unspecified deep veins of unspecified lower extremity: I82.409

## 2023-10-29 LAB — BASIC METABOLIC PANEL WITH GFR
Anion gap: 10 (ref 5–15)
BUN: 13 mg/dL (ref 6–20)
CO2: 23 mmol/L (ref 22–32)
Calcium: 9.3 mg/dL (ref 8.9–10.3)
Chloride: 106 mmol/L (ref 98–111)
Creatinine, Ser: 0.84 mg/dL (ref 0.61–1.24)
GFR, Estimated: >60 mL/min (ref >60)
Glucose, Bld: 94 mg/dL (ref 70–99)
Potassium: 3.9 mmol/L (ref 3.5–5.1)
Sodium: 139 mmol/L (ref 135–145)

## 2023-10-29 LAB — CBC WITH DIFFERENTIAL/PLATELET
Abs Immature Granulocytes: 0.02 K/uL (ref 0.00–0.07)
Basophils Absolute: 0.1 K/uL (ref 0.0–0.1)
Basophils Relative: 1 %
Eosinophils Absolute: 0.3 K/uL (ref 0.0–0.5)
Eosinophils Relative: 4 %
HCT: 49.3 % (ref 39.0–52.0)
Hemoglobin: 16 g/dL (ref 13.0–17.0)
Immature Granulocytes: 0 %
Lymphocytes Relative: 30 %
Lymphs Abs: 2.2 K/uL (ref 0.7–4.0)
MCH: 29 pg (ref 26.0–34.0)
MCHC: 32.5 g/dL (ref 30.0–36.0)
MCV: 89.5 fL (ref 80.0–100.0)
Monocytes Absolute: 0.7 K/uL (ref 0.1–1.0)
Monocytes Relative: 10 %
Neutro Abs: 4.2 K/uL (ref 1.7–7.7)
Neutrophils Relative %: 55 %
Platelets: 204 K/uL (ref 150–400)
RBC: 5.51 MIL/uL (ref 4.22–5.81)
RDW: 14 % (ref 11.5–15.5)
WBC: 7.5 K/uL (ref 4.0–10.5)
nRBC: 0 % (ref 0.0–0.2)

## 2023-10-29 LAB — TROPONIN T, HIGH SENSITIVITY: Troponin T High Sensitivity: <15 ng/L (ref <19)

## 2023-10-29 MED ORDER — METHOCARBAMOL 500 MG PO TABS
500.0000 mg | ORAL_TABLET | Freq: Three times a day (TID) | ORAL | 0 refills | Status: AC | PRN
  Filled 2023-10-29: qty 21, 7d supply, fill #0

## 2023-10-29 NOTE — ED Triage Notes (Signed)
 Left leg/swollen and some pain for about a month.  Past year had a blood clot dx in both legs. Pt was on xarelto  for few months after that.   No shortness of breath.   Pt has sharp pain across top of his back between shoulder blades.

## 2023-10-29 NOTE — Discharge Instructions (Signed)
 You can use Tylenol  and Motrin as needed for pain.  You can also use the Robaxin  as needed for muscle spasms in your back.  Use warm compresses over the area of concern in your leg.  Make sure to follow-up with your primary care doctor later this week.  Call the office Monday morning make an appointment.  Discussed with them the cardiology referral and your atrial fibrillation.

## 2023-10-29 NOTE — ED Provider Notes (Signed)
 [place] EMERGENCY DEPARTMENT AT [facility] Provider Note   CSN: [id number] Arrival date & time: 10/29/23  9170     Patient presents with: Leg Swelling   Eddie Marshall is a 43 y.o. male.   43 year old male presents for evaluation of leg swelling.  He states he has a history of blood clots and noticed some swelling in his upper leg that is tender to the touch about 4 days ago.  States he used to be on Xarelto  for the blood clots he no longer takes this.  He states he thinks he may have a history of A-fib but does not take any medication for this.  Nuys any shortness of breath but does complain of some upper chest pain and back pain that comes and and is sharp in nature.  He denies any shortness of breath, diaphoresis, lightheadedness or nausea.  Denies any other symptoms or concerns at this time.        Prior to Admission medications   Medication Sig Start Date End Date Taking? Authorizing Provider  methocarbamol  (ROBAXIN ) 500 MG tablet Take 1 tablet (500 mg total) by mouth 3 (three) times daily as needed for up to 7 days for muscle spasms. 10/29/23 11/05/23 Yes Ladine Kiper L, DO  acetaminophen  (TYLENOL ) 325 MG tablet Take 2 tablets (650 mg total) by mouth every 6 (six) hours as needed for mild pain (pain score 1-3) (or Fever >/= 101). 05/23/23   Ricky Fines, MD  albuterol  (VENTOLIN  HFA) 108 [phone] Base) MCG/ACT inhaler Inhale 2 puffs into the lungs every 6 (six) hours as needed for wheezing or shortness of breath. 01/26/23   Melvenia Manus BRAVO, MD  aspirin EC 81 MG tablet Take 81 mg by mouth daily.    [provider]  lidocaine  (LIDODERM ) 5 % Place 1 patch onto the skin daily. Remove & Discard patch within 12 hours or as directed by MD 09/06/23   Melvenia Manus BRAVO, MD  methocarbamol  (ROBAXIN ) 500 MG tablet Take 1 tablet (500 mg total) by mouth every 6 (six) hours as needed for muscle spasms. 07/14/23   Melvenia Manus BRAVO, MD  olmesartan  (BENICAR ) 40 MG tablet Take 1 tablet (40  mg total) by mouth daily. 08/16/23   Melvenia Manus BRAVO, MD  promethazine -dextromethorphan (PROMETHAZINE -DM) 6.25-15 MG/5ML syrup Take 5 mLs by mouth 4 (four) times daily as needed for cough. 06/06/23   McElwee, Lauren A, NP  rivaroxaban  (XARELTO ) 20 MG TABS tablet Take 1 tablet (20 mg total) by mouth daily with supper for 70 doses. 06/21/23 08/30/23  Melvenia Manus BRAVO, MD  Semaglutide -Weight Management 2.4 MG/0.75ML SOAJ Inject 2.4 mg into the skin once a week. 09/13/23   Melvenia Manus BRAVO, MD    Allergies: Other    Review of Systems  Constitutional:  Negative for chills and fever.  HENT:  Negative for ear pain and sore throat.   Eyes:  Negative for pain and visual disturbance.  Respiratory:  Negative for cough and shortness of breath.   Cardiovascular:  Positive for chest pain and leg swelling. Negative for palpitations.  Gastrointestinal:  Negative for abdominal pain and vomiting.  Genitourinary:  Negative for dysuria and hematuria.  Musculoskeletal:  Negative for arthralgias and back pain.  Skin:  Negative for color change and rash.  Neurological:  Negative for seizures and syncope.  All other systems reviewed and are negative.   Updated Vital Signs BP 128/87   Pulse (!) 34   Temp 97.8 F (36.6 C) (Oral)  Resp 15   SpO2 99%   Physical Exam Vitals and nursing note reviewed.  Constitutional:      General: He is not in acute distress.    Appearance: Normal appearance. He is well-developed. He is not ill-appearing.  HENT:     Head: Normocephalic and atraumatic.  Eyes:     Conjunctiva/sclera: Conjunctivae normal.  Cardiovascular:     Rate and Rhythm: Normal rate and regular rhythm.     Heart sounds: Normal heart sounds. No murmur heard. Pulmonary:     Effort: Pulmonary effort is normal. No respiratory distress.     Breath sounds: Normal breath sounds. No stridor. No wheezing or rhonchi.  Abdominal:     Palpations: Abdomen is soft.     Tenderness: There is no abdominal tenderness.   Musculoskeletal:        General: No swelling.     Cervical back: Neck supple.     Comments: Bilateral lower extremity swelling, there is to palpation, left thigh with palpable cord that is tender to palpation on the left medial aspect of the thigh   Skin:    General: Skin is warm and dry.     Capillary Refill: Capillary refill takes less than 2 seconds.  Neurological:     Mental Status: He is alert.  Psychiatric:        Mood and Affect: Mood normal.     (all labs ordered are listed, but only abnormal results are displayed) Labs Reviewed  BASIC METABOLIC PANEL WITH GFR  CBC WITH DIFFERENTIAL/PLATELET  TROPONIN T, HIGH SENSITIVITY  TROPONIN T, HIGH SENSITIVITY    EKG: EKG Interpretation Date/Time:  Saturday October 29 2023 08:46:28 EDT Ventricular Rate:  98 PR Interval:    QRS Duration:  94 QT Interval:  359 QTC Calculation: 459 R Axis:   59  Text Interpretation: Atrial fibrillation Ventricular premature complex Borderline low voltage, extremity leads No significnat change when compared to previous EKG from 05/19/2023 Confirmed by Gennaro Bouchard ([id number]) on 10/29/2023 8:52:20 AM  Radiology: US  Venous Img Lower Unilateral Left Result Date: 10/29/2023 CLINICAL DATA:  Left thigh pain with recurrent left saphenous vein thrombophlebitis. EXAM: LEFT LOWER EXTREMITY VENOUS DOPPLER ULTRASOUND TECHNIQUE: Gray-scale sonography with graded compression, as well as color Doppler and duplex ultrasound were performed to evaluate the lower extremity deep venous systems from the level of the common femoral vein and including the common femoral, femoral, profunda femoral, popliteal and calf veins including the posterior tibial, peroneal and gastrocnemius veins when visible. The superficial great saphenous vein was also interrogated. Spectral Doppler was utilized to evaluate flow at rest and with distal augmentation maneuvers in the common femoral, femoral and popliteal veins. COMPARISON:  None  Available. FINDINGS: Contralateral Common Femoral Vein: Respiratory phasicity is normal and symmetric with the symptomatic side. No evidence of thrombus. Normal compressibility. Common Femoral Vein: No evidence of thrombus. Normal compressibility, respiratory phasicity and response to augmentation. Saphenofemoral Junction: No evidence of thrombus. Normal compressibility and flow on color Doppler imaging. Profunda Femoral Vein: No evidence of thrombus. Normal compressibility and flow on color Doppler imaging. Femoral Vein: No evidence of thrombus. Normal compressibility, respiratory phasicity and response to augmentation. Popliteal Vein: No evidence of thrombus. Normal compressibility, respiratory phasicity and response to augmentation. Calf Veins: No evidence of thrombus. Normal compressibility and flow on color Doppler imaging. Superficial Great Saphenous Vein: There is thrombophlebitis of enlarged great saphenous vein in the mid to distal left thigh also communicates with other thrombosed and enlarged varicose veins of  the distal thigh extending towards the knee. Venous Reflux:  None. Other Findings:  No abnormal fluid collections. IMPRESSION: 1. No evidence of left lower extremity deep vein thrombosis. 2. Thrombophlebitis of enlarged great saphenous vein in the mid to distal left thigh which communicates with other thrombosed and enlarged varicose veins of the distal thigh extending towards the knee. Electronically Signed   By: Marcey Moan M.D.   On: 10/29/2023 10:45   DG Chest 1 View Result Date: 10/29/2023 CLINICAL DATA:  Chest pain and leg swelling. EXAM: CHEST  1 VIEW COMPARISON:  05/19/2023 FINDINGS: Lordotic film. Cardiopericardial silhouette is at upper limits of normal for size. Low lung volumes. The lungs are clear without focal pneumonia, edema, pneumothorax or pleural effusion. The cardiopericardial silhouette is within normal limits for size. No acute bony abnormality. Telemetry leads overlie  the chest. IMPRESSION: Low volume film without acute cardiopulmonary findings. Electronically Signed   By: Camellia Candle M.D.   On: 10/29/2023 09:29     Procedures   Medications Ordered in the ED - No data to display                                  Medical Decision Making Medical Decision Making Nursing notes are reviewed. Differential diagnosis for this patient would include but not limited to: Atrial fibrillation, DVT, superficial thrombophlebitis, pneumothorax, muscle spasm, other   Cardiac monitor interpretation: Atrial fibrillation, rate between 80s and 110s  Emergency Department Course:  Vital signs and pulse oximetry are reviewed, evaluated by myself and found to be within normal limits prior to final disposition. Findings of laboratory testing and medical imaging are discussed with patient and family that is available. Patient agrees with the medical care plan as follows:  Patient's lab workup reviewed by me and completely unremarkable.  Troponins are negative.  I think the chest pain is likely musculoskeletal.  Will prescribe him Robaxin  for this.  He does have evidence of superficial thrombophlebitis on his lower extremity ultrasound, but no DVT.  Advised ibuprofen as needed for pain.  He is not on any blood thinners.  Advise warm compresses as well.  Advise close follow-up with his primary care doctor and otherwise return to the ER for new or worsening symptoms.  He feels comfortable to plan to be discharged home.  Problems Addressed: Atrial fibrillation, unspecified type [facility]): chronic illness or injury Muscle spasm of back: acute illness or injury Thrombophlebitis of superficial veins of left lower extremity: acute illness or injury  Amount and/or Complexity of Data Reviewed External Data Reviewed: notes.    Details: Prior records reviewed and patient seen in the past for A-fib, however he is not on any anticoagulation Labs: ordered. Decision-making details  documented in ED Course.    Details: Ordered and reviewed by me and showed no abnormality, negative troponin BMP and CBC Radiology: ordered and independent interpretation performed. Decision-making details documented in ED Course.    Details: Ordered and interpreted independently of radiology Chest x-ray shows no acute abnormality in the chest ECG/medicine tests: ordered and independent interpretation performed. Decision-making details documented in ED Course.    Details: Ordered and interpreted in the absence of cardiology and shows atrial fibrillation with controlled ventricular response, no STEMI and no acute abnormality when compared to prior  Risk OTC drugs. Prescription drug management.     Final diagnoses:  Thrombophlebitis of superficial veins of left lower extremity  Atrial fibrillation, unspecified  type [facility])  Muscle spasm of back    ED Discharge Orders          Ordered    methocarbamol  (ROBAXIN ) 500 MG tablet  3 times daily PRN        10/29/23 1055               Taysha Majewski, [place] L, DO 10/29/23 1442

## 2023-10-29 NOTE — ED Notes (Signed)
 Dc instructions reviewed with patient. Patient voiced understanding. Dc with belongings.

## 2023-11-15 ENCOUNTER — Other Ambulatory Visit (HOSPITAL_COMMUNITY): Payer: Self-pay

## 2023-11-18 ENCOUNTER — Other Ambulatory Visit: Payer: Self-pay

## 2023-11-18 MED ORDER — OLMESARTAN MEDOXOMIL 40 MG PO TABS: 40.0000 mg | ORAL_TABLET | Freq: Every day | ORAL | 0 refills | Status: DC

## 2023-11-18 NOTE — Telephone Encounter (Signed)
 Copied from CRM [id number]. Topic: Clinical - Medication Refill >> Nov 18, 2023  3:09 PM Sasha H wrote: Medication: olmesartan  (BENICAR ) 40 MG tablet  Has the patient contacted their pharmacy? Yes (Agent: If no, request that the patient contact the pharmacy for the refill. If patient does not wish to contact the pharmacy document the reason why and proceed with request.) (Agent: If yes, when and what did the pharmacy advise?)  This is the patient's preferred pharmacy:  [facility] [phone] - [place], [place] - [address] AT [facility] [address] [place] [address] Phone: [phone] Fax: [phone]   Is this the correct pharmacy for this prescription? Yes If no, delete pharmacy and type the correct one.   Has the prescription been filled recently? Yes  Is the patient out of the medication? No  Has the patient been seen for an appointment in the last year OR does the patient have an upcoming appointment? Yes  Can we respond through MyChart? Yes  Agent: Please be advised that Rx refills may take up to 3 business days. We ask that you follow-up with your pharmacy.

## 2023-12-07 ENCOUNTER — Ambulatory Visit

## 2023-12-13 ENCOUNTER — Other Ambulatory Visit: Payer: Self-pay | Admitting: Internal Medicine

## 2023-12-13 ENCOUNTER — Other Ambulatory Visit (HOSPITAL_COMMUNITY): Payer: Self-pay

## 2023-12-13 ENCOUNTER — Other Ambulatory Visit: Payer: Self-pay

## 2023-12-13 MED ORDER — WEGOVY 2.4 MG/0.75ML ~~LOC~~ SOAJ
2.4000 mg | SUBCUTANEOUS | 2 refills | Status: DC
  Filled 2023-12-13: qty 3, 28d supply, fill #0
  Filled 2024-01-03: qty 3, 28d supply, fill #1

## 2023-12-15 ENCOUNTER — Other Ambulatory Visit (HOSPITAL_COMMUNITY): Payer: Self-pay

## 2023-12-16 ENCOUNTER — Other Ambulatory Visit (HOSPITAL_COMMUNITY): Payer: Self-pay

## 2023-12-20 ENCOUNTER — Ambulatory Visit

## 2023-12-20 VITALS — BP 120/88 | HR 77 | Ht 67.0 in | Wt 234.0 lb

## 2023-12-20 DIAGNOSIS — M160 Bilateral primary osteoarthritis of hip: Secondary | ICD-10-CM | POA: Diagnosis not present

## 2023-12-20 DIAGNOSIS — M545 Low back pain, unspecified: Secondary | ICD-10-CM

## 2023-12-20 DIAGNOSIS — G8929 Other chronic pain: Secondary | ICD-10-CM

## 2023-12-20 MED ORDER — TRAMADOL HCL 50 MG PO TABS: 50.0000 mg | ORAL_TABLET | Freq: Four times a day (QID) | ORAL | 0 refills | Status: DC | PRN

## 2023-12-20 NOTE — Progress Notes (Unsigned)
 Established Patient Office Visit  Subjective   Patient ID: Eddie Marshall, male    DOB: Dec 10, 1980  Age: 43 y.o. 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   Urinary retention 05/20/2023   Paroxysmal atrial fibrillation with RVR (HCC) 05/20/2023   Hypokalemia 05/20/2023   Superficial vein thrombosis 12/29/2022   Leg mass, left 12/13/2022   Severe obstructive sleep apnea 12/13/2022   Vitamin D  deficiency 09/15/2022   Hyperbilirubinemia 09/15/2022   Intertrigo 09/15/2022   Essential hypertension 08/13/2022   Morbid obesity (HCC) 08/13/2022   Osteoarthritis of hips, bilateral 08/13/2022   Snoring 08/13/2022   Persistent cough 08/13/2022   Dizziness 08/13/2022    ROS    Objective:     BP (!) 140/96   Pulse 77   Ht 5' 7 (1.702 m)   Wt 234 lb (106.1 kg)   SpO2 97%   BMI 36.65 kg/m  BP Readings from Last 3 Encounters:  12/20/23 (!) 140/96  10/29/23 128/87  09/06/23 110/74   Wt Readings from Last 3 Encounters:  12/20/23 234 lb (106.1 kg)  09/06/23 249 lb 9.6 oz (113.2 kg)  07/14/23 257 lb 12.8 oz (116.9 kg)     Physical Exam Vitals and nursing note reviewed.  Constitutional:      Appearance: Normal appearance.  HENT:     Head: Normocephalic.  Eyes:     Extraocular Movements: Extraocular movements intact.     Pupils: Pupils are equal, round, and reactive to light.  Cardiovascular:     Rate and Rhythm: Normal rate and regular rhythm.  Pulmonary:     Effort: Pulmonary effort is normal.     Breath sounds: Normal breath sounds.  Musculoskeletal:     Cervical back: Normal range of motion and neck supple.  Neurological:     Mental Status: He is alert and oriented to person, place,  and time.  Psychiatric:        Mood and Affect: Mood normal.        Thought Content: Thought content normal.     No results found for any visits on 12/20/23.  Last CBC Lab Results  Component Value Date   WBC 7.5 10/29/2023   HGB 16.0 10/29/2023   HCT 49.3 10/29/2023   MCV 89.5 10/29/2023   MCH 29.0 10/29/2023   RDW 14.0 10/29/2023   PLT 204 10/29/2023   Last metabolic panel Lab Results  Component Value Date   GLUCOSE 94 10/29/2023   NA 139 10/29/2023   K 3.9 10/29/2023   CL 106 10/29/2023   CO2 23 10/29/2023   BUN 13 10/29/2023   CREATININE 0.84 10/29/2023   GFRNONAA >60 10/29/2023   CALCIUM 9.3 10/29/2023   PROT 7.2 09/16/2022   ALBUMIN 4.4 09/16/2022   LABGLOB 2.8 09/16/2022   AGRATIO 1.6 09/16/2022   BILITOT 1.4 (H) 09/16/2022   ALKPHOS 57 09/16/2022   AST 21 09/16/2022   ALT 23 09/16/2022   ANIONGAP 10 10/29/2023   Last lipids Lab Results  Component Value Date   CHOL 123 08/16/2022   HDL 38 (L) 08/16/2022   LDLCALC 72 08/16/2022   TRIG 59 08/16/2022   CHOLHDL 3.2 08/16/2022   Last hemoglobin A1c  Lab Results  Component Value Date   HGBA1C 5.6 08/16/2022   Last thyroid functions Lab Results  Component Value Date   TSH 2.430 08/16/2022   Last vitamin D  Lab Results  Component Value Date   VD25OH 31.3 12/13/2022   Last vitamin B12 and Folate Lab Results  Component Value Date   VITAMINB12 322 08/16/2022   FOLATE 8.3 08/16/2022      The ASCVD Risk score (Arnett DK, et al., 2019) failed to calculate for the following reasons:   The valid total cholesterol range is 130 to 320 mg/dL    Assessment & Plan:   Problem List Items Addressed This Visit   None   No follow-ups on file.    Leita Longs, FNP

## 2023-12-22 NOTE — Assessment & Plan Note (Signed)
 Chronic issue.  No red flag symptoms identified.  Lidocaine  patches are not helping with pain control.   Over-the-counter medications and muscle relaxers insufficient.  - Prescribe tramadol  for pain management. - Advise taking tramadol  with acetaminophen  for added pain relief. - PDMP reviewed.

## 2023-12-22 NOTE — Assessment & Plan Note (Signed)
 Bilateral hip osteoarthritis with associated low back pain Chronic bilateral hip osteoarthritis with persistent pain despite weight loss. Over-the-counter medications and muscle relaxers insufficient. Tramadol  considered for severe pain relief. - Prescribe tramadol  for pain management. - Advise taking tramadol  with acetaminophen  for added pain relief. - PDMP reviewed.

## 2023-12-26 ENCOUNTER — Telehealth: Payer: Self-pay | Admitting: Pharmacy Technician

## 2023-12-26 ENCOUNTER — Other Ambulatory Visit (HOSPITAL_COMMUNITY): Payer: Self-pay

## 2023-12-26 NOTE — Telephone Encounter (Signed)
 Pharmacy Patient Advocate Encounter  Received notification from [employer] that Prior Authorization for traMADol  HCl 50MG  tablets has been APPROVED from 12/26/2023 to 06/23/2024. Unable to obtain price due to refill too soon rejection, last fill date 12/24/2023 next available fill date09/01/2024.   PA #/Case ID/Reference #: [id number]

## 2023-12-26 NOTE — Telephone Encounter (Signed)
 Pharmacy Patient Advocate Encounter   Received notification from Cover[employer]eds that prior authorization for traMADol  HCl 50MG  tablets is required/requested.   Insurance verification completed.   The patient is insured through [employer] .   Per test claim: PA required; PA submitted to above mentioned insurance via Latent Key/confirmation #/EOC [id number] Status is pending

## 2024-01-03 ENCOUNTER — Other Ambulatory Visit (HOSPITAL_COMMUNITY): Payer: Self-pay

## 2024-01-11 ENCOUNTER — Ambulatory Visit: Payer: Self-pay

## 2024-01-11 ENCOUNTER — Other Ambulatory Visit (HOSPITAL_COMMUNITY): Payer: Self-pay

## 2024-01-11 NOTE — Telephone Encounter (Signed)
 Pt was advised ED and UC or to call if appt needed

## 2024-01-11 NOTE — Telephone Encounter (Signed)
 Called [facility] and advised them of patient refusing the E.D. at this time.

## 2024-01-11 NOTE — Telephone Encounter (Signed)
 FYI Only or Action Required?: FYI only for provider.  Patient was last seen in primary care on 12/20/2023 by Bevely Doffing, FNP.  Called Nurse Triage reporting Dizziness.  Symptoms began off and on for dizziness, .  Interventions attempted: Rest, hydration, or home remedies.  Symptoms are: gradually worsening.  Triage Disposition: Go to ED Now (or PCP Triage)  Patient/caregiver understands and will follow disposition?: No, refuses disposition              Copied from CRM [id number]. Topic: Clinical - Red Word Triage >> Jan 11, 2024  9:03 AM Tonda B wrote: [facility] that prompted transfer to Nurse Triage: pt has  shoulder pain as well as dizziness Reason for Disposition  Patient sounds very sick or weak to the triager    Patient states bilateral shoulder pain, across the top of his back across both shoulder blades, severe---accompanied with dizziness to a point he had to sit down, and he states vision issues (states from looking at his phone a lot) but with the pain being severe across the entire top of his chest (both shoulders), patient also having a history of blood clots---it is recommended that he go to the ER  Answer Assessment - Initial Assessment Questions Patient states bilateral shoulder pain, across the top of his back across both shoulder blades, severe---accompanied with dizziness to a point he had to sit down, and he states vision issues (states from looking at his phone a lot) but with the pain being severe across the entire top of his chest (both shoulders), patient also having a history of blood clots---it is recommended that he go to the ER  Dizzy spell---patient states Across shoulder blades---off and on for a few weeks but  Patient states vision problems from looking at his phone Severe dizziness Severe pain Has to do a double shift today Patient asked about Urgent Care and is advised that the Emergency Room is recommended at this time Patient is advised  to call us  with changes and if things get worse call 911 He did verbalized understanding of that but unclear if he will go be seen today  Protocols used: Back Pain-A-AH

## 2024-01-16 ENCOUNTER — Telehealth: Payer: Self-pay

## 2024-01-16 NOTE — Telephone Encounter (Signed)
 Copied from CRM [id number]. Topic: Clinical - Medication Question >> Jan 16, 2024 12:19 PM Eddie Marshall wrote: Reason for CRM: pt stated his insurance wont cover semaglutide -weight management (WEGOVY ) 2.4 MG/0.75ML SOAJ SQ injection [[id number]] after October 1st  Wondering if he can get something different/similar. Please advise [phone].

## 2024-01-17 ENCOUNTER — Other Ambulatory Visit (HOSPITAL_COMMUNITY): Payer: Self-pay

## 2024-01-17 ENCOUNTER — Other Ambulatory Visit: Payer: Self-pay

## 2024-01-17 DIAGNOSIS — G4733 Obstructive sleep apnea (adult) (pediatric): Secondary | ICD-10-CM

## 2024-01-17 MED ORDER — TIRZEPATIDE-WEIGHT MANAGEMENT 2.5 MG/0.5ML ~~LOC~~ SOAJ
2.5000 mg | SUBCUTANEOUS | 1 refills | Status: DC
  Filled 2024-01-17 – 2024-04-03 (×4): qty 2, 28d supply, fill #0

## 2024-01-17 NOTE — Telephone Encounter (Signed)
 Pt was agreeable to switching to Zepbound instead

## 2024-01-18 ENCOUNTER — Telehealth: Payer: Self-pay

## 2024-01-18 ENCOUNTER — Telehealth: Payer: Self-pay | Admitting: Pharmacy Technician

## 2024-01-18 ENCOUNTER — Other Ambulatory Visit (HOSPITAL_COMMUNITY): Payer: Self-pay

## 2024-01-18 ENCOUNTER — Ambulatory Visit: Admitting: Urology

## 2024-01-18 NOTE — Telephone Encounter (Signed)
 Copied from CRM [id number]. Topic: Clinical - Medication Question >> Jan 16, 2024 12:19 PM Eddie Marshall wrote: Reason for CRM: pt stated his insurance wont cover semaglutide -weight management (WEGOVY ) 2.4 MG/0.75ML SOAJ SQ injection [[id number]] after October 1st  Wondering if he can get something different/similar. Please advise [phone]. >> Jan 18, 2024  3:09 PM Emylou G wrote: Patient called.. said is there another alternative to the glp 1 injections?  Pls call patient

## 2024-01-18 NOTE — Telephone Encounter (Signed)
 Pharmacy Patient Advocate Encounter   Received notification from CoverMyMeds that prior authorization for Zepbound 2.5MG /0.5ML pen-injectors is required/requested.   Insurance verification completed.   The patient is insured through [employer].   Under the new coverage criteria set by Medicaid effective Oct. 1st  for coverage for Zepbound he must have a BMI of greater than or equal to 40 kg/m2. Even though he has been on Wegovy  it will have to be entered in as an initial coverage PA and his BMI is not high enough to be approved.  Please see Zepbound coverage criteria below.

## 2024-01-19 NOTE — Telephone Encounter (Signed)
 Pt was told that his BMI being lower than 40 is the reason he wont be covered for it.

## 2024-01-20 ENCOUNTER — Other Ambulatory Visit: Payer: Self-pay

## 2024-01-20 ENCOUNTER — Other Ambulatory Visit (HOSPITAL_COMMUNITY): Payer: Self-pay

## 2024-01-20 DIAGNOSIS — M160 Bilateral primary osteoarthritis of hip: Secondary | ICD-10-CM

## 2024-01-20 DIAGNOSIS — G8929 Other chronic pain: Secondary | ICD-10-CM

## 2024-01-23 NOTE — Telephone Encounter (Signed)
 Is gonna talk to insurance to see if he can qualify for the wegovy  or Zepbound since he has a history of Sleep Apnea and B-Fib, wants to know if you want to see him for those prior diagnosis for the wegovy  or zepbound

## 2024-02-07 NOTE — Telephone Encounter (Unsigned)
 Copied from CRM [id number]. Topic: Clinical - Medication Refill >> Feb 07, 2024  1:37 PM Harlene ORN wrote: Medication: traMADol  (ULTRAM ) 50 MG tablet  Has the patient contacted their pharmacy? No (Agent: If no, request that the patient contact the pharmacy for the refill. If patient does not wish to contact the pharmacy document the reason why and proceed with request.) (Agent: If yes, when and what did the pharmacy advise?)  This is the patient's preferred pharmacy:   [facility] [phone] - [place], [place] - [address] AT [facility] [address] [place] [address] Phone: [phone] Fax: [phone]  Is this the correct pharmacy for this prescription? Yes If no, delete pharmacy and type the correct one.   Has the prescription been filled recently? Yes  Is the patient out of the medication? Yes  Has the patient been seen for an appointment in the last year OR does the patient have an upcoming appointment? Yes  Can we respond through MyChart? No  Agent: Please be advised that Rx refills may take up to 3 business days. We ask that you follow-up with your pharmacy.

## 2024-02-09 ENCOUNTER — Other Ambulatory Visit: Payer: Self-pay

## 2024-02-09 DIAGNOSIS — M160 Bilateral primary osteoarthritis of hip: Secondary | ICD-10-CM

## 2024-02-09 DIAGNOSIS — M545 Low back pain, unspecified: Secondary | ICD-10-CM

## 2024-03-05 ENCOUNTER — Other Ambulatory Visit: Payer: Self-pay

## 2024-03-05 NOTE — Telephone Encounter (Signed)
 Copied from CRM #[id number]. Topic: Clinical - Medication Refill >> Mar 05, 2024  4:58 PM Wess RAMAN wrote: Medication: olmesartan  (BENICAR ) 40 MG tablet   Has the patient contacted their pharmacy? Yes (Agent: If no, request that the patient contact the pharmacy for the refill. If patient does not wish to contact the pharmacy document the reason why and proceed with request.) (Agent: If yes, when and what did the pharmacy advise?)  This is the patient's preferred pharmacy:   [facility] [phone] - [place], [place] - [address] AT [facility] OF [address] [place] [address] Phone: [phone] Fax: [phone]  Is this the correct pharmacy for this prescription? Yes If no, delete pharmacy and type the correct one.   Has the prescription been filled recently? Yes  Is the patient out of the medication? No  Has the patient been seen for an appointment in the last year OR does the patient have an upcoming appointment? Yes  Can we respond through MyChart? No  Agent: Please be advised that Rx refills may take up to 3 business days. We ask that you follow-up with your pharmacy.

## 2024-03-06 MED ORDER — OLMESARTAN MEDOXOMIL 40 MG PO TABS: 40.0000 mg | ORAL_TABLET | Freq: Every day | ORAL | 0 refills | Status: AC

## 2024-03-09 ENCOUNTER — Other Ambulatory Visit (HOSPITAL_COMMUNITY): Payer: Self-pay

## 2024-03-12 ENCOUNTER — Other Ambulatory Visit (HOSPITAL_COMMUNITY): Payer: Self-pay

## 2024-03-28 ENCOUNTER — Other Ambulatory Visit (HOSPITAL_COMMUNITY): Payer: Self-pay

## 2024-03-28 ENCOUNTER — Telehealth (HOSPITAL_COMMUNITY): Payer: Self-pay

## 2024-03-28 ENCOUNTER — Other Ambulatory Visit: Payer: Self-pay

## 2024-03-28 DIAGNOSIS — M160 Bilateral primary osteoarthritis of hip: Secondary | ICD-10-CM

## 2024-03-28 DIAGNOSIS — G8929 Other chronic pain: Secondary | ICD-10-CM

## 2024-03-29 ENCOUNTER — Other Ambulatory Visit (HOSPITAL_COMMUNITY): Payer: Self-pay

## 2024-03-29 ENCOUNTER — Telehealth (HOSPITAL_COMMUNITY): Payer: Self-pay

## 2024-03-29 NOTE — Telephone Encounter (Signed)
 Pharmacy Patient Advocate Encounter   Received notification from Pt Calls Messages that prior authorization for Zepbound  2.5MG /0.5ML pen-injectors  is required/requested.   Insurance verification completed.   The patient is insured through [employer].   Per test claim: PA required; PA submitted to above mentioned insurance via Latent Key/confirmation #/EOC [id number] Status is pending

## 2024-03-29 NOTE — Telephone Encounter (Signed)
 PA request has been Received. New Encounter has been or will be created for follow up. For additional info see Pharmacy Prior Auth telephone encounter from 03/29/24.

## 2024-03-29 NOTE — Telephone Encounter (Signed)
 Pharmacy Patient Advocate Encounter  Received notification from [employer] that Prior Authorization for  Zepbound  2.5MG /0.5ML pen-injectors  has been DENIED.  See denial reason below. No denial letter attached in CMM. Will attach denial letter to Media tab once received.   PA #/Case ID/Reference #: [id number]   *we need an updated weight within the last 45 days, the last weight check I see is from September 2025. Please update weight with current chart notes for [facility] in order for us  to resubmit or appeal this decision.

## 2024-04-03 ENCOUNTER — Other Ambulatory Visit (HOSPITAL_COMMUNITY): Payer: Self-pay

## 2024-04-16 ENCOUNTER — Other Ambulatory Visit (HOSPITAL_COMMUNITY): Payer: Self-pay

## 2024-04-16 ENCOUNTER — Ambulatory Visit: Payer: Self-pay

## 2024-04-16 ENCOUNTER — Telehealth (HOSPITAL_COMMUNITY): Payer: Self-pay

## 2024-04-16 VITALS — BP 115/80 | HR 75 | Ht 67.0 in | Wt 262.0 lb

## 2024-04-16 DIAGNOSIS — M545 Low back pain, unspecified: Secondary | ICD-10-CM | POA: Diagnosis not present

## 2024-04-16 DIAGNOSIS — I480 Paroxysmal atrial fibrillation: Secondary | ICD-10-CM | POA: Diagnosis not present

## 2024-04-16 DIAGNOSIS — Z6841 Body Mass Index (BMI) 40.0 and over, adult: Secondary | ICD-10-CM

## 2024-04-16 DIAGNOSIS — G8929 Other chronic pain: Secondary | ICD-10-CM

## 2024-04-16 DIAGNOSIS — G4733 Obstructive sleep apnea (adult) (pediatric): Secondary | ICD-10-CM

## 2024-04-16 MED ORDER — TIRZEPATIDE-WEIGHT MANAGEMENT 2.5 MG/0.5ML ~~LOC~~ SOAJ
2.5000 mg | SUBCUTANEOUS | 2 refills | Status: DC
  Filled 2024-04-16 – 2024-04-18 (×2): qty 2, 28d supply, fill #0

## 2024-04-16 NOTE — Telephone Encounter (Signed)
 PA request has been Received. New Encounter has been or will be created for follow up. For additional info see Pharmacy Prior Auth telephone encounter from 04/16/24.

## 2024-04-16 NOTE — Progress Notes (Unsigned)
 "  Established Patient Office Visit  Subjective   Patient ID: Eddie Marshall, male    DOB: 1981-04-08  Age: 43 y.o. MRN: [id number]  Chief Complaint  Patient presents with   Medical Management of Chronic Issues    Pt here for weight check to get Zepbound      HPI  Overweight/Obesity Complicated by constant back pain/ hip pain, doesn't cook, works night shift:  Goal Not Met. Has gained 30 lbs in past 3 months  Unable to achieve goal weight loss through lifestyle modification alone; current treatment:none;   Medications/Strategies previously tried: [employer]  Baseline weight: 250 lbs; most recent weight: 262 lbs  Current meal patterns: breakfast: sometimes/ chicken biscuit; lunch: peanut butter and jelly; dinner: sandwiches/fast food; snacks: chips, crackers; drinks: water , some sodas  Current exercise: not able to exercise much d/t chronic back and hip pain  Extensive dietary counseling including education on focus on lean proteins, fruits and vegetables, whole grains and increased fiber consumption, adequate hydration  Extensive exercise counseling including eventual goal of 150 minutes of moderate intensity exercise weekly; role of resistance training; breaking up prolonged sedentary time  Recommended adding Zepbound  and working on dietary changes and increased physical activity.    CPAP:  last OV with pulmonary was 11/2022 with Dr. Shellia.  Patient was not able to sleep with CPAP machine and eventually took it back.  He has not been back to see pulmonary since that time.  No current CPAP use.   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   Urinary retention 05/20/2023   Paroxysmal atrial fibrillation with RVR (HCC) 05/20/2023   Hypokalemia 05/20/2023   Superficial vein thrombosis 12/29/2022   Leg mass, left 12/13/2022   Severe  obstructive sleep apnea 12/13/2022   Vitamin D  deficiency 09/15/2022   Hyperbilirubinemia 09/15/2022   Intertrigo 09/15/2022   Essential hypertension 08/13/2022   Morbid obesity (HCC) 08/13/2022   Osteoarthritis of hips, 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  Constitutional:      Appearance: Normal appearance. He is obese.  HENT:     Head: Normocephalic.  Eyes:     Extraocular Movements: Extraocular movements intact.     Pupils: Pupils are equal, round, and reactive to light.  Cardiovascular:     Rate and Rhythm: Normal rate and regular rhythm.  Pulmonary:     Effort: Pulmonary effort is normal.     Breath sounds: Normal breath sounds.  Musculoskeletal:     Cervical back: Normal range of motion and neck supple.  Neurological:     Mental Status: He is alert and oriented to person, place, and time.  Psychiatric:        Mood and Affect: Mood normal.  Thought Content: Thought content normal.      No results found for any visits on 04/16/24.  {Labs (Optional):23779}  The ASCVD Risk score (Arnett DK, et al., 2019) failed to calculate for the following reasons:   The valid total cholesterol range is 130 to 320 mg/dL    Assessment & Plan:   Problem List Items Addressed This Visit   None   No follow-ups on file.    Leita Longs, FNP  "

## 2024-04-17 ENCOUNTER — Other Ambulatory Visit: Payer: Self-pay

## 2024-04-17 DIAGNOSIS — Z6841 Body Mass Index (BMI) 40.0 and over, adult: Secondary | ICD-10-CM | POA: Insufficient documentation

## 2024-04-17 NOTE — Assessment & Plan Note (Signed)
 Diagnosed during hospitalization. Previous superficial leg clots did not require anticoagulation. Cardiology referral needed for management and medication.

## 2024-04-17 NOTE — Assessment & Plan Note (Signed)
 Pain worsened with weight gain. Previously improved with weight loss. Limits exercise ability.

## 2024-04-17 NOTE — Assessment & Plan Note (Signed)
 Weight increased by 30 pounds since September. Wegovy  discontinued due to insurance issues. Current weight 262 pounds, baseline 249 pounds in May. Poor dietary habits and limited exercise due to pain. - Submitted prior authorization for Zepbound .

## 2024-04-17 NOTE — Assessment & Plan Note (Signed)
 AHI score of 50. Insurance requires prior authorization for Zepbound . CPAP use is problematic due to discomfort. - Initiated prior authorization for Zepbound . - Updated weight in chart for insurance.

## 2024-04-17 NOTE — Telephone Encounter (Signed)
 I tried to resubmit with the updated chart notes but they have not been signed. I need signed chart notes in order to submit the prior auth.

## 2024-04-17 NOTE — Telephone Encounter (Signed)
 Pharmacy Patient Advocate Encounter   Received notification from Pt Calls Messages that prior authorization for Zepbound  2.5MG /0.5ML pen-injectors  is required/requested.   Insurance verification completed.   The patient is insured through [employer].   Per test claim: PA required; PA submitted to above mentioned insurance via Latent Key/confirmation #/EOC [id number] Status is pending

## 2024-04-17 NOTE — Telephone Encounter (Signed)
 Note signed

## 2024-04-18 ENCOUNTER — Other Ambulatory Visit (HOSPITAL_COMMUNITY): Payer: Self-pay

## 2024-04-18 ENCOUNTER — Other Ambulatory Visit: Payer: Self-pay

## 2024-04-18 NOTE — Telephone Encounter (Signed)
 Pharmacy Patient Advocate Encounter  Received notification from [employer] that Prior Authorization for Zepbound  2.5MG /0.5ML pen-injectors  has been APPROVED from 04/18/24 to 10/15/24. Ran test claim, Copay is $4. This test claim was processed through [facility]- copay amounts may vary at other pharmacies due to pharmacy/plan contracts, or as the patient moves through the different stages of their insurance plan.   PA #/Case ID/Reference #: [id number]

## 2024-05-08 ENCOUNTER — Other Ambulatory Visit (HOSPITAL_BASED_OUTPATIENT_CLINIC_OR_DEPARTMENT_OTHER): Payer: Self-pay

## 2024-05-08 ENCOUNTER — Other Ambulatory Visit (HOSPITAL_COMMUNITY): Payer: Self-pay

## 2024-05-09 ENCOUNTER — Other Ambulatory Visit (HOSPITAL_COMMUNITY): Payer: Self-pay

## 2024-05-15 ENCOUNTER — Other Ambulatory Visit (HOSPITAL_COMMUNITY): Payer: Self-pay

## 2024-05-15 ENCOUNTER — Telehealth: Payer: Self-pay

## 2024-05-15 ENCOUNTER — Other Ambulatory Visit: Payer: Self-pay

## 2024-05-15 DIAGNOSIS — G4733 Obstructive sleep apnea (adult) (pediatric): Secondary | ICD-10-CM

## 2024-05-15 MED ORDER — TIRZEPATIDE-WEIGHT MANAGEMENT 2.5 MG/0.5ML ~~LOC~~ SOAJ
2.5000 mg | SUBCUTANEOUS | 2 refills | Status: AC
  Filled 2024-05-15: qty 2, 28d supply, fill #0

## 2024-05-15 NOTE — Telephone Encounter (Signed)
 Patient advised

## 2024-05-15 NOTE — Telephone Encounter (Signed)
 Copied from CRM #[id number]. Topic: Clinical - Medication Refill >> May 11, 2024  3:43 PM Emylou G wrote: Medication: tirzepatide  (ZEPBOUND ) 2.5 MG/0.5ML Pen  Has the patient contacted their pharmacy? No (Agent: If no, request that the patient contact the pharmacy for the refill. If patient does not wish to contact the pharmacy document the reason why and proceed with request.) (Agent: If yes, when and what did the pharmacy advise?)  This is the patient's preferred pharmacy:  [facility] [address] [place] [address] Phone: [phone] Fax: [phone]  Is this the correct pharmacy for this prescription? Yes If no, delete pharmacy and type the correct one.   Has the prescription been filled recently? No  Is the patient out of the medication? Yes  Has the patient been seen for an appointment in the last year OR does the patient have an upcoming appointment? Yes  Can we respond through MyChart? Yes  Agent: Please be advised that Rx refills may take up to 3 business days. We ask that you follow-up with your pharmacy.

## 2024-05-15 NOTE — Telephone Encounter (Signed)
"  Zepbound  refilled  "

## 2024-05-16 ENCOUNTER — Other Ambulatory Visit (HOSPITAL_COMMUNITY): Payer: Self-pay

## 2024-05-31 ENCOUNTER — Ambulatory Visit: Admitting: Internal Medicine

## 2024-06-19 ENCOUNTER — Ambulatory Visit
